# Patient Record
Sex: Male | Born: 1995 | Race: White | Hispanic: No | Marital: Single | Smoking: Current every day smoker
Health system: Southern US, Community
[De-identification: ages and names within clinical notes are randomized; demographics above are authoritative.]

## PROBLEM LIST (undated history)

## (undated) DIAGNOSIS — R079 Chest pain, unspecified: Secondary | ICD-10-CM

## (undated) DIAGNOSIS — F32A Depression, unspecified: Secondary | ICD-10-CM

## (undated) DIAGNOSIS — F419 Anxiety disorder, unspecified: Secondary | ICD-10-CM

## (undated) DIAGNOSIS — R0789 Other chest pain: Secondary | ICD-10-CM

## (undated) DIAGNOSIS — R945 Abnormal results of liver function studies: Secondary | ICD-10-CM

## (undated) HISTORY — DX: Depression, unspecified: F32.A

## (undated) HISTORY — PX: WISDOM TOOTH EXTRACTION: SHX21

## (undated) HISTORY — DX: Other chest pain: R07.89

## (undated) HISTORY — DX: Abnormal results of liver function studies: R94.5

## (undated) HISTORY — DX: Anxiety disorder, unspecified: F41.9

## (undated) HISTORY — DX: Chest pain, unspecified: R07.9

---
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
## 2016-04-20 ENCOUNTER — Emergency Department (HOSPITAL_COMMUNITY)
Admission: EM | Admit: 2016-04-20 | Discharge: 2016-04-20 | Disposition: A | Discharge status: Home / Self Care | Payer: Commercial Managed Care - PPO | Attending: Emergency Medicine | Admitting: Emergency Medicine

## 2016-04-20 ENCOUNTER — Encounter (HOSPITAL_COMMUNITY): Payer: Self-pay | Admitting: Emergency Medicine

## 2016-04-20 ENCOUNTER — Emergency Department (HOSPITAL_COMMUNITY): Payer: Commercial Managed Care - PPO

## 2016-04-20 DIAGNOSIS — F17200 Nicotine dependence, unspecified, uncomplicated: Secondary | ICD-10-CM | POA: Diagnosis not present

## 2016-04-20 DIAGNOSIS — K920 Hematemesis: Secondary | ICD-10-CM

## 2016-04-20 LAB — COMPREHENSIVE METABOLIC PANEL
ALBUMIN: 4.9 g/dL (ref 3.5–5.0)
ALT: 34 U/L (ref 17–63)
ANION GAP: 9 (ref 5–15)
AST: 30 U/L (ref 15–41)
Alkaline Phosphatase: 77 U/L (ref 38–126)
BUN: 10 mg/dL (ref 6–20)
CHLORIDE: 101 mmol/L (ref 101–111)
CO2: 26 mmol/L (ref 22–32)
Calcium: 10.1 mg/dL (ref 8.9–10.3)
Creatinine, Ser: 0.89 mg/dL (ref 0.61–1.24)
GFR calc non Af Amer: >60 mL/min (ref >60)
GLUCOSE: 82 mg/dL (ref 65–99)
Potassium: 4 mmol/L (ref 3.5–5.1)
SODIUM: 136 mmol/L (ref 135–145)
Total Bilirubin: 0.7 mg/dL (ref 0.3–1.2)
Total Protein: 7.3 g/dL (ref 6.5–8.1)

## 2016-04-20 LAB — CBC
HCT: 48.9 % (ref 39.0–52.0)
HEMOGLOBIN: 16.7 g/dL (ref 13.0–17.0)
MCH: 32 pg (ref 26.0–34.0)
MCHC: 34.2 g/dL (ref 30.0–36.0)
MCV: 93.7 fL (ref 78.0–100.0)
Platelets: 201 10*3/uL (ref 150–400)
RBC: 5.22 MIL/uL (ref 4.22–5.81)
RDW: 12.6 % (ref 11.5–15.5)
WBC: 7.5 10*3/uL (ref 4.0–10.5)

## 2016-04-20 LAB — LIPASE, BLOOD: LIPASE: 23 U/L (ref 11–51)

## 2016-04-20 MED ORDER — ONDANSETRON 4 MG PO TBDP: 4.0000 mg | ORAL_TABLET | Freq: Once | ORAL | Status: DC | PRN

## 2016-04-20 MED ORDER — ONDANSETRON 4 MG PO TBDP
ORAL_TABLET | ORAL | Status: AC
  Filled 2016-04-20: qty 1

## 2016-04-20 MED ORDER — PANTOPRAZOLE SODIUM 20 MG PO TBEC: 20.0000 mg | DELAYED_RELEASE_TABLET | Freq: Every day | ORAL | 0 refills | Status: DC

## 2016-04-20 NOTE — ED Triage Notes (Addendum)
Onset today states emesis bright red and then black blood x1 episode. Denies abdominal pain or diarrhea. Airway intact bilateral equal chest rise and fall.

## 2016-04-20 NOTE — ED Notes (Signed)
Pt informed of delay. Pt cooperative and understanding.

## 2016-04-20 NOTE — ED Provider Notes (Signed)
[facility]-EMERGENCY DEPT Provider Note   CSN: [id number] Arrival date & time: 04/20/16  1346     History   Chief Complaint Chief Complaint  Patient presents with  . Hematemesis    HPI Jerome Levine is a 20 y.o. male.  HPI   Patient is a 20 year old male who presents emergency department for evaluation of one episode of hematemesis. He states that he had one vomiting episode that occurred today at noon, roughly 7 hours ago. He states it was bright red blood the first portion of emesis and the end of his emesis became coffee-ground black.  She did not have any further vomiting. He denies abdominal pain, nausea, back pain. He was given Aleve partially one month ago in with a back strain that is only taken it one to 2 times in the past week, he does not take any other NSAIDs or steroids. He consumes alcohol daily, at least 3 beers a day and a 6-12 pack on the weekends.  He has no history of GI bleed, or GERD.  He denies any chest pain, shortness of breath, weakness, fatigue, pallor.  History reviewed. No pertinent past medical history.  There are no active problems to display for this patient.   History reviewed. No pertinent surgical history.     Home Medications    Prior to Admission medications   Medication Sig Start Date End Date Taking? Authorizing Provider  pantoprazole (PROTONIX) 20 MG tablet Take 1 tablet (20 mg total) by mouth daily. 04/20/16   Danelle Berry, PA-C    Family History No family history on file.  Social History Social History  Substance Use Topics  . Smoking status: Current Every Day Smoker  . Smokeless tobacco: Former Neurosurgeon  . Alcohol use Yes     Allergies   Review of patient's allergies indicates no known allergies.   Review of Systems Review of Systems  All other systems reviewed and are negative.    Physical Exam Updated Vital Signs BP 118/64 (BP Location: Right Arm)   Pulse (!) 52   Temp 98.5 F (36.9 C) (Oral)   Resp 16   Ht 5\' 11"   (1.803 m)   Wt 83.9 kg   SpO2 100%   BMI 25.80 kg/m   Physical Exam  Constitutional: He is oriented to person, place, and time. He appears well-developed and well-nourished. No distress.  HENT:  Head: Normocephalic and atraumatic.  Right Ear: External ear normal.  Left Ear: External ear normal.  Nose: Nose normal.  Mouth/Throat: Oropharynx is clear and moist. No oropharyngeal exudate.  Eyes: Conjunctivae and EOM are normal. Pupils are equal, round, and reactive to light. Right eye exhibits no discharge. Left eye exhibits no discharge. No scleral icterus.  Neck: Normal range of motion. Neck supple. No JVD present. No tracheal deviation present.  Cardiovascular: Normal rate, regular rhythm, normal heart sounds and intact distal pulses.  Exam reveals no gallop and no friction rub.   No murmur heard. Pulmonary/Chest: Effort normal and breath sounds normal. No stridor. No respiratory distress. He has no wheezes. He has no rales. He exhibits no tenderness.  Abdominal: Soft. Bowel sounds are normal. He exhibits no distension and no mass. There is no tenderness. There is no rebound and no guarding.  Musculoskeletal: Normal range of motion. He exhibits no edema.  Lymphadenopathy:    He has no cervical adenopathy.  Neurological: He is alert and oriented to person, place, and time. He exhibits normal muscle tone. Coordination normal.  Skin: Skin  is warm and dry. Capillary refill takes less than 2 seconds. No rash noted. He is not diaphoretic. No erythema. No pallor.  Psychiatric: He has a normal mood and affect. His behavior is normal. Judgment and thought content normal.  Nursing note and vitals reviewed.    ED Treatments / Results  Labs (all labs ordered are listed, but only abnormal results are displayed) Labs Reviewed  LIPASE, BLOOD  COMPREHENSIVE METABOLIC PANEL  CBC    EKG  EKG Interpretation None       Radiology Dg Chest 2 View  Result Date: 04/20/2016 CLINICAL DATA:   Hemoptysis this morning.  Smoker and ETOH use. EXAM: CHEST  2 VIEW COMPARISON:  None. FINDINGS: The heart size and mediastinal contours are within normal limits. Both lungs are clear. The visualized skeletal structures are unremarkable. IMPRESSION: No active cardiopulmonary disease. Electronically Signed   By: Elberta Fortisaniel  Boyle M.D.   On: 04/20/2016 15:28    Procedures Procedures (including critical care time)  Medications Ordered in ED Medications  ondansetron (ZOFRAN-ODT) disintegrating tablet 4 mg (not administered)     Initial Impression / Assessment and Plan / ED Course  I have reviewed the triage vital signs and the nursing notes.  Pertinent labs & imaging results that were available during my care of the patient were reviewed by me and considered in my medical decision making (see chart for details).  Clinical Course  One episode of vomiting that was read initially and then became black.  He had no further vomiting and denies melena or hematochezia. He has no abdominal pain, no symptoms of anemia, including no chest pain, no shortness of breath, no pallor, no weakness. Blood work obtained was unremarkable, chest x-ray was negative.  No further symptoms or vomiting episodes, do not suspect a continued GI bleed. Risk factors includes any recent NSAID use and daily alcohol use, discussed with him avoiding NSAIDs and decreasing alcohol intake. We'll give him PPI and GI referral as needed.  Pt stable to D/C home, return precautions reviewed and pt verbalized understanding.   Final Clinical Impressions(s) / ED Diagnoses   Final diagnoses:  Hematemesis without nausea    New Prescriptions New Prescriptions   PANTOPRAZOLE (PROTONIX) 20 MG TABLET    Take 1 tablet (20 mg total) by mouth daily.     Danelle BerryLeisa Dequarius Jeffries, PA-C 04/22/16 2314    Benjiman CoreNathan Pickering, MD 04/23/16 [phone]

## 2016-04-20 NOTE — Discharge Instructions (Signed)
Decrease your alcohol intake, avoid NSAIDs - non-steroidal anti-inflammatories, such as ibuprofen, naproxen, aleve. Take meds as prescribed You had negative X-rays.  Your blood work in normal.

## 2017-11-23 DIAGNOSIS — L731 Pseudofolliculitis barbae: Secondary | ICD-10-CM | POA: Diagnosis not present

## 2017-11-23 DIAGNOSIS — K1121 Acute sialoadenitis: Secondary | ICD-10-CM | POA: Diagnosis not present

## 2017-12-26 DIAGNOSIS — M546 Pain in thoracic spine: Secondary | ICD-10-CM | POA: Diagnosis not present

## 2017-12-26 DIAGNOSIS — S39012A Strain of muscle, fascia and tendon of lower back, initial encounter: Secondary | ICD-10-CM | POA: Diagnosis not present

## 2018-02-10 DIAGNOSIS — R599 Enlarged lymph nodes, unspecified: Secondary | ICD-10-CM | POA: Diagnosis not present

## 2018-03-18 DIAGNOSIS — R590 Localized enlarged lymph nodes: Secondary | ICD-10-CM | POA: Diagnosis not present

## 2018-03-18 DIAGNOSIS — F17200 Nicotine dependence, unspecified, uncomplicated: Secondary | ICD-10-CM | POA: Diagnosis not present

## 2018-03-18 DIAGNOSIS — R221 Localized swelling, mass and lump, neck: Secondary | ICD-10-CM | POA: Diagnosis not present

## 2018-05-06 ENCOUNTER — Other Ambulatory Visit: Payer: Self-pay | Admitting: Otolaryngology

## 2018-05-06 DIAGNOSIS — R590 Localized enlarged lymph nodes: Secondary | ICD-10-CM

## 2018-05-10 ENCOUNTER — Other Ambulatory Visit: Payer: Commercial Managed Care - PPO

## 2018-05-17 ENCOUNTER — Ambulatory Visit
Admission: RE | Admit: 2018-05-17 | Discharge: 2018-05-17 | Disposition: A | Discharge status: Home / Self Care | Payer: Commercial Managed Care - PPO | Source: Ambulatory Visit | Attending: Otolaryngology | Admitting: Otolaryngology

## 2018-05-17 DIAGNOSIS — R590 Localized enlarged lymph nodes: Secondary | ICD-10-CM

## 2018-05-17 DIAGNOSIS — R1310 Dysphagia, unspecified: Secondary | ICD-10-CM | POA: Diagnosis not present

## 2018-05-17 MED ORDER — IOPAMIDOL (ISOVUE-300) INJECTION 61%
75.0000 mL | Freq: Once | INTRAVENOUS | Status: AC | PRN
  Administered 2018-05-17: 75 mL via INTRAVENOUS

## 2018-08-05 DIAGNOSIS — N481 Balanitis: Secondary | ICD-10-CM | POA: Diagnosis not present

## 2018-10-10 DIAGNOSIS — K600 Acute anal fissure: Secondary | ICD-10-CM | POA: Diagnosis not present

## 2018-11-14 DIAGNOSIS — K602 Anal fissure, unspecified: Secondary | ICD-10-CM | POA: Diagnosis not present

## 2018-11-15 DIAGNOSIS — K625 Hemorrhage of anus and rectum: Secondary | ICD-10-CM | POA: Diagnosis not present

## 2018-11-24 DIAGNOSIS — K625 Hemorrhage of anus and rectum: Secondary | ICD-10-CM | POA: Insufficient documentation

## 2018-11-24 HISTORY — DX: Hemorrhage of anus and rectum: K62.5

## 2018-12-13 ENCOUNTER — Emergency Department (HOSPITAL_COMMUNITY): Payer: Commercial Managed Care - PPO

## 2018-12-13 ENCOUNTER — Emergency Department (HOSPITAL_COMMUNITY)
Admission: EM | Admit: 2018-12-13 | Discharge: 2018-12-14 | Disposition: A | Discharge status: Home / Self Care | Payer: Commercial Managed Care - PPO | Attending: Emergency Medicine | Admitting: Emergency Medicine

## 2018-12-13 ENCOUNTER — Encounter (HOSPITAL_COMMUNITY): Payer: Self-pay | Admitting: Emergency Medicine

## 2018-12-13 DIAGNOSIS — Z79899 Other long term (current) drug therapy: Secondary | ICD-10-CM | POA: Insufficient documentation

## 2018-12-13 DIAGNOSIS — R51 Headache: Secondary | ICD-10-CM | POA: Insufficient documentation

## 2018-12-13 DIAGNOSIS — M542 Cervicalgia: Secondary | ICD-10-CM | POA: Diagnosis not present

## 2018-12-13 DIAGNOSIS — F17210 Nicotine dependence, cigarettes, uncomplicated: Secondary | ICD-10-CM | POA: Insufficient documentation

## 2018-12-13 DIAGNOSIS — R519 Headache, unspecified: Secondary | ICD-10-CM

## 2018-12-13 LAB — CBC
HCT: 44.4 % (ref 39.0–52.0)
Hemoglobin: 15.7 g/dL (ref 13.0–17.0)
MCH: 31.5 pg (ref 26.0–34.0)
MCHC: 35.4 g/dL (ref 30.0–36.0)
MCV: 89.2 fL (ref 80.0–100.0)
Platelets: 196 10*3/uL (ref 150–400)
RBC: 4.98 MIL/uL (ref 4.22–5.81)
RDW: 12.4 % (ref 11.5–15.5)
WBC: 7.5 10*3/uL (ref 4.0–10.5)
nRBC: 0 % (ref 0.0–0.2)

## 2018-12-13 LAB — BASIC METABOLIC PANEL
Anion gap: 11 (ref 5–15)
BUN: 14 mg/dL (ref 6–20)
CO2: 24 mmol/L (ref 22–32)
Calcium: 9.5 mg/dL (ref 8.9–10.3)
Chloride: 97 mmol/L — ABNORMAL LOW (ref 98–111)
Creatinine, Ser: 0.8 mg/dL (ref 0.61–1.24)
GFR calc Af Amer: >60 mL/min (ref >60)
GFR calc non Af Amer: >60 mL/min (ref >60)
Glucose, Bld: 90 mg/dL (ref 70–99)
Potassium: 4 mmol/L (ref 3.5–5.1)
Sodium: 132 mmol/L — ABNORMAL LOW (ref 135–145)

## 2018-12-13 NOTE — ED Triage Notes (Signed)
Pt in POV, reports he was having intercourse when he developed sudden HA and nausea with radiation into neck. Pt reports all S/S have subsided besides slight nausea. No neuro deficits. A/O

## 2018-12-14 ENCOUNTER — Encounter (HOSPITAL_COMMUNITY): Payer: Self-pay | Admitting: Emergency Medicine

## 2018-12-14 ENCOUNTER — Emergency Department (HOSPITAL_COMMUNITY): Payer: Commercial Managed Care - PPO

## 2018-12-14 MED ORDER — ACETAMINOPHEN 500 MG PO TABS
1000.0000 mg | ORAL_TABLET | Freq: Once | ORAL | Status: AC
  Administered 2018-12-14: 1000 mg via ORAL
  Filled 2018-12-14: qty 2

## 2018-12-14 MED ORDER — IOHEXOL 350 MG/ML SOLN
100.0000 mL | Freq: Once | INTRAVENOUS | Status: AC | PRN
  Administered 2018-12-14: 100 mL via INTRAVENOUS

## 2018-12-14 NOTE — ED Notes (Signed)
Patient transported to CT 

## 2018-12-14 NOTE — ED Provider Notes (Signed)
[facility] Provider Note   CSN: [id number] Arrival date & time: 12/13/18  1942    History   Chief Complaint Chief Complaint  Patient presents with   Headache    HPI Thora LanceJustin Bourassa is a 23 y.o. male.     The history is provided by the patient.  Headache  Pain location:  Occipital Quality:  Dull Severity currently:  0/10 Severity at highest:  9/10 Onset quality:  Sudden Duration:  1 hour Timing:  Constant Progression:  Resolved Chronicity:  New Similar to prior headaches: no   Context: intercourse   Context comment:  5 large beers just and was intoxicated  Relieved by:  Nothing Worsened by:  Nothing Ineffective treatments:  None tried Associated symptoms: no cough, no diarrhea, no dizziness, no facial pain, no fatigue, no fever, no focal weakness, no nausea, no neck pain, no neck stiffness, no numbness, no seizures, no syncope, no vomiting and no weakness   Risk factors: no anger and no family hx of SAH   Patient had been drinking a lot this afternoon and admitted to being intoxicated and then engaged in intercourse with his significant other and had an occipital headache and said it was one of the worst he's had.  No neck stiffness, no vomiting. It subsided on its own during intercourse and has gone away but as it was different he c  History reviewed. No pertinent past medical history.  There are no active problems to display for this patient.   Past Surgical History:  Procedure Laterality Date   WISDOM TOOTH EXTRACTION          Home Medications    Prior to Admission medications   Medication Sig Start Date End Date Taking? Authorizing Provider  pantoprazole (PROTONIX) 20 MG tablet Take 1 tablet (20 mg total) by mouth daily. 04/20/16   Danelle Berryapia, Leisa, PA-C    Family History No family history on file.  Social History Social History   Tobacco Use   Smoking status: Current Every Day Smoker    Packs/day: 0.25   Types: Cigarettes   Smokeless tobacco: Former NeurosurgeonUser  Substance Use Topics   Alcohol use: Yes    Comment: Socially    Drug use: Yes    Types: Marijuana     Allergies   Patient has no known allergies.   Review of Systems Review of Systems  Constitutional: Negative for fatigue and fever.  Eyes: Negative for visual disturbance.  Respiratory: Negative for cough.   Cardiovascular: Negative for chest pain and syncope.  Gastrointestinal: Negative for diarrhea, nausea and vomiting.  Musculoskeletal: Negative for neck pain and neck stiffness.  Neurological: Positive for headaches. Negative for dizziness, focal weakness, seizures, syncope, facial asymmetry, speech difficulty, weakness and numbness.  All other systems reviewed and are negative.    Physical Exam Updated Vital Signs BP 106/74    Pulse (!) 59    Temp 98.2 F (36.8 C) (Oral)    Resp 17    Ht 5\' 11"  (1.803 m)    Wt 90.7 kg    SpO2 98%    BMI 27.89 kg/m   Physical Exam Vitals signs and nursing note reviewed.  Constitutional:      General: He is not in acute distress.    Appearance: He is normal weight. He is not toxic-appearing.  HENT:     Head: Normocephalic and atraumatic.     Nose: Nose normal.     Mouth/Throat:     Mouth:  Mucous membranes are moist.  Eyes:     Extraocular Movements: Extraocular movements intact.     Conjunctiva/sclera: Conjunctivae normal.     Pupils: Pupils are equal, round, and reactive to light.  Neck:     Musculoskeletal: Normal range of motion and neck supple.  Cardiovascular:     Rate and Rhythm: Normal rate and regular rhythm.     Pulses: Normal pulses.     Heart sounds: Normal heart sounds.  Pulmonary:     Effort: Pulmonary effort is normal.     Breath sounds: Normal breath sounds.  Abdominal:     General: Abdomen is flat. Bowel sounds are normal.  Musculoskeletal: Normal range of motion.  Skin:    General: Skin is warm and dry.     Capillary Refill: Capillary refill takes  less than 2 seconds.  Neurological:     General: No focal deficit present.     Mental Status: He is alert and oriented to person, place, and time.     Cranial Nerves: No cranial nerve deficit.     Deep Tendon Reflexes: Reflexes normal.  Psychiatric:        Mood and Affect: Mood normal.      ED Treatments / Results  Labs (all labs ordered are listed, but only abnormal results are displayed) Results for orders placed or performed during the hospital encounter of 12/13/18  CBC  Result Value Ref Range   WBC 7.5 4.0 - 10.5 K/uL   RBC 4.98 4.22 - 5.81 MIL/uL   Hemoglobin 15.7 13.0 - 17.0 g/dL   HCT 32.5 49.8 - 26.4 %   MCV 89.2 80.0 - 100.0 fL   MCH 31.5 26.0 - 34.0 pg   MCHC 35.4 30.0 - 36.0 g/dL   RDW 15.8 30.9 - 40.7 %   Platelets 196 150 - 400 K/uL   nRBC 0.0 0.0 - 0.2 %  Basic metabolic panel  Result Value Ref Range   Sodium 132 (L) 135 - 145 mmol/L   Potassium 4.0 3.5 - 5.1 mmol/L   Chloride 97 (L) 98 - 111 mmol/L   CO2 24 22 - 32 mmol/L   Glucose, Bld 90 70 - 99 mg/dL   BUN 14 6 - 20 mg/dL   Creatinine, Ser 6.80 0.61 - 1.24 mg/dL   Calcium 9.5 8.9 - 88.1 mg/dL   GFR calc non Af Amer >60 >60 mL/min   GFR calc Af Amer >60 >60 mL/min   Anion gap 11 5 - 15   Ct Angio Head W Or Wo Contrast  Result Date: 12/14/2018 CLINICAL DATA:  23 y/o M; sudden headache with nausea radiating into the neck during intercourse. EXAM: CT ANGIOGRAPHY HEAD AND NECK TECHNIQUE: Multidetector CT imaging of the head and neck was performed using the standard protocol during bolus administration of intravenous contrast. Multiplanar CT image reconstructions and MIPs were obtained to evaluate the vascular anatomy. Carotid stenosis measurements (when applicable) are obtained utilizing NASCET criteria, using the distal internal carotid diameter as the denominator. CONTRAST:  OMNIPAQUE IOHEXOL 350 MG/ML SOLN COMPARISON:  12/13/2018 CT head FINDINGS: CTA NECK FINDINGS Aortic arch: Left vertebral artery  arises from the aortic arch. Imaged portion shows no evidence of aneurysm or dissection. No significant stenosis of the major arch vessel origins. Right carotid system: No evidence of dissection, stenosis (50% or greater) or occlusion. Left carotid system: No evidence of dissection, stenosis (50% or greater) or occlusion. Vertebral arteries: Right dominant. No evidence of dissection, stenosis (50% or  greater) or occlusion. Skeleton: Negative. Other neck: Negative. Upper chest: Negative. Review of the MIP images confirms the above findings CTA HEAD FINDINGS Anterior circulation: No significant stenosis, proximal occlusion, aneurysm, or vascular malformation. Posterior circulation: No significant stenosis, proximal occlusion, aneurysm, or vascular malformation. Venous sinuses: As permitted by contrast timing, patent. Anatomic variants: Complete circle-of-Willis. Delayed phase: No abnormal intracranial enhancement. Review of the MIP images confirms the above findings IMPRESSION: Normal CTA of the head and neck. Electronically Signed   By: Mitzi Hansen M.D.   On: 12/14/2018 03:44   Ct Head Wo Contrast  Result Date: 12/13/2018 CLINICAL DATA:  Acute onset and nausea postcoital EXAM: CT HEAD WITHOUT CONTRAST TECHNIQUE: Contiguous axial images were obtained from the base of the skull through the vertex without intravenous contrast. COMPARISON:  None. FINDINGS: Brain: The ventricles are normal in size and configuration. There is no intracranial mass, hemorrhage, extra-axial fluid collection, or midline shift. The brain parenchyma appears unremarkable. There is no evident acute infarct. Vascular: There is no hyperdense vessel. There is no vascular calcification evident. Skull: The bony calvarium appears intact. Sinuses/Orbits: There is opacification of a midportion right ethmoid air cell. Other visualized paranasal sinuses are clear. Orbits appear symmetric bilaterally. Other: Mastoid air cells are clear.  IMPRESSION: Right-sided ethmoid air cell disease.  Study otherwise unremarkable. Electronically Signed   By: Bretta Bang III M.D.   On: 12/13/2018 20:47   Ct Angio Neck W And/or Wo Contrast  Result Date: 12/14/2018 CLINICAL DATA:  23 y/o M; sudden headache with nausea radiating into the neck during intercourse. EXAM: CT ANGIOGRAPHY HEAD AND NECK TECHNIQUE: Multidetector CT imaging of the head and neck was performed using the standard protocol during bolus administration of intravenous contrast. Multiplanar CT image reconstructions and MIPs were obtained to evaluate the vascular anatomy. Carotid stenosis measurements (when applicable) are obtained utilizing NASCET criteria, using the distal internal carotid diameter as the denominator. CONTRAST:  OMNIPAQUE IOHEXOL 350 MG/ML SOLN COMPARISON:  12/13/2018 CT head FINDINGS: CTA NECK FINDINGS Aortic arch: Left vertebral artery arises from the aortic arch. Imaged portion shows no evidence of aneurysm or dissection. No significant stenosis of the major arch vessel origins. Right carotid system: No evidence of dissection, stenosis (50% or greater) or occlusion. Left carotid system: No evidence of dissection, stenosis (50% or greater) or occlusion. Vertebral arteries: Right dominant. No evidence of dissection, stenosis (50% or greater) or occlusion. Skeleton: Negative. Other neck: Negative. Upper chest: Negative. Review of the MIP images confirms the above findings CTA HEAD FINDINGS Anterior circulation: No significant stenosis, proximal occlusion, aneurysm, or vascular malformation. Posterior circulation: No significant stenosis, proximal occlusion, aneurysm, or vascular malformation. Venous sinuses: As permitted by contrast timing, patent. Anatomic variants: Complete circle-of-Willis. Delayed phase: No abnormal intracranial enhancement. Review of the MIP images confirms the above findings IMPRESSION: Normal CTA of the head and neck. Electronically Signed    By: Mitzi Hansen M.D.   On: 12/14/2018 03:44   Medications  acetaminophen (TYLENOL) tablet 1,000 mg (1,000 mg Oral Given 12/14/18 0235)  iohexol (OMNIPAQUE) 350 MG/ML injection 100 mL (100 mLs Intravenous Contrast Given 12/14/18 0323)     Final Clinical Impressions(s) / ED Diagnoses        Return for intractable cough, coughing up blood,fevers >100.4 unrelieved by medication, shortness of breath, intractable vomiting, chest pain, shortness of breath, weakness,numbness, changes in speech, facial asymmetry,abdominal pain, passing out,Inability to tolerate liquids or food, cough, altered mental status or any concerns. No signs of systemic  illness or infection. The patient is nontoxic-appearing on exam and vital signs are within normal limits.   I have reviewed the triage vital signs and the nursing notes. Pertinent labs &imaging results that were available during my care of the patient were reviewed by me and considered in my medical decision making (see chart for details).  After history, exam, and medical workup I feel the patient has been appropriately medically screened and is safe for discharge home. Pertinent diagnoses were discussed with the patient. Patient was given return precautions    ED Discharge Orders       Bartley Vuolo, MD 12/14/18 [id number]

## 2019-03-01 ENCOUNTER — Encounter (HOSPITAL_COMMUNITY): Payer: Self-pay | Admitting: Emergency Medicine

## 2019-03-01 ENCOUNTER — Other Ambulatory Visit: Payer: Self-pay

## 2019-03-01 ENCOUNTER — Emergency Department (HOSPITAL_COMMUNITY): Payer: Commercial Managed Care - PPO

## 2019-03-01 ENCOUNTER — Emergency Department (HOSPITAL_COMMUNITY)
Admission: EM | Admit: 2019-03-01 | Discharge: 2019-03-01 | Disposition: A | Discharge status: Home / Self Care | Payer: Commercial Managed Care - PPO | Attending: Emergency Medicine | Admitting: Emergency Medicine

## 2019-03-01 DIAGNOSIS — S8992XA Unspecified injury of left lower leg, initial encounter: Secondary | ICD-10-CM | POA: Diagnosis present

## 2019-03-01 DIAGNOSIS — Y929 Unspecified place or not applicable: Secondary | ICD-10-CM | POA: Insufficient documentation

## 2019-03-01 DIAGNOSIS — S82392A Other fracture of lower end of left tibia, initial encounter for closed fracture: Secondary | ICD-10-CM | POA: Insufficient documentation

## 2019-03-01 DIAGNOSIS — S0081XA Abrasion of other part of head, initial encounter: Secondary | ICD-10-CM | POA: Insufficient documentation

## 2019-03-01 DIAGNOSIS — Y999 Unspecified external cause status: Secondary | ICD-10-CM | POA: Diagnosis not present

## 2019-03-01 DIAGNOSIS — F17210 Nicotine dependence, cigarettes, uncomplicated: Secondary | ICD-10-CM | POA: Insufficient documentation

## 2019-03-01 DIAGNOSIS — S82402A Unspecified fracture of shaft of left fibula, initial encounter for closed fracture: Secondary | ICD-10-CM | POA: Diagnosis not present

## 2019-03-01 DIAGNOSIS — Y939 Activity, unspecified: Secondary | ICD-10-CM | POA: Diagnosis not present

## 2019-03-01 LAB — BASIC METABOLIC PANEL
Anion gap: 11 (ref 5–15)
BUN: 9 mg/dL (ref 6–20)
CO2: 24 mmol/L (ref 22–32)
Calcium: 9.4 mg/dL (ref 8.9–10.3)
Chloride: 102 mmol/L (ref 98–111)
Creatinine, Ser: 0.92 mg/dL (ref 0.61–1.24)
GFR calc Af Amer: >60 mL/min (ref >60)
GFR calc non Af Amer: >60 mL/min (ref >60)
Glucose, Bld: 101 mg/dL — ABNORMAL HIGH (ref 70–99)
Potassium: 3.8 mmol/L (ref 3.5–5.1)
Sodium: 137 mmol/L (ref 135–145)

## 2019-03-01 LAB — CBC
HCT: 46.4 % (ref 39.0–52.0)
Hemoglobin: 16 g/dL (ref 13.0–17.0)
MCH: 31.3 pg (ref 26.0–34.0)
MCHC: 34.5 g/dL (ref 30.0–36.0)
MCV: 90.8 fL (ref 80.0–100.0)
Platelets: 177 10*3/uL (ref 150–400)
RBC: 5.11 MIL/uL (ref 4.22–5.81)
RDW: 12.1 % (ref 11.5–15.5)
WBC: 8.1 10*3/uL (ref 4.0–10.5)
nRBC: 0 % (ref 0.0–0.2)

## 2019-03-01 MED ORDER — SODIUM CHLORIDE 0.9 % IV BOLUS
1000.0000 mL | Freq: Once | INTRAVENOUS | Status: AC
  Administered 2019-03-01: 1000 mL via INTRAVENOUS

## 2019-03-01 MED ORDER — OXYCODONE-ACETAMINOPHEN 5-325 MG PO TABS
2.0000 | ORAL_TABLET | Freq: Once | ORAL | Status: AC
  Administered 2019-03-01: 2 via ORAL
  Filled 2019-03-01: qty 2

## 2019-03-01 MED ORDER — OXYCODONE-ACETAMINOPHEN 5-325 MG PO TABS: 2.0000 | ORAL_TABLET | ORAL | 0 refills | Status: DC | PRN

## 2019-03-01 NOTE — Progress Notes (Signed)
Orthopedic Tech Progress Note Patient Details:  Jerome Levine [birth year] [id number]  Ortho Devices Type of Ortho Device: Crutches, Post (short leg) splint Ortho Device/Splint Location: lle Ortho Device/Splint Interventions: Ordered, Application, Adjustment   Post Interventions Patient Tolerated: Well Instructions Provided: Care of device, Adjustment of device   Karolee Stamps 03/01/2019, 9:21 PM
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
## 2019-03-01 NOTE — ED Notes (Signed)
Patient verbalizes understanding of discharge instructions. Opportunity for questioning and answers were provided. Armband removed by staff, pt discharged from ED in wheelchair but ambulatory with crutches provided.
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
## 2019-03-01 NOTE — Discharge Instructions (Signed)
Please keep leg elevated and do not put weight on the left leg Use cold therapy for pain and swelling Return if worsening pain and swelling not controlled by elevation , cold therapy, and pain medications Call DR. Haddix office Monday morning for recheck next week
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
## 2019-03-04 ENCOUNTER — Other Ambulatory Visit (HOSPITAL_COMMUNITY): Payer: Commercial Managed Care - PPO

## 2019-03-04 ENCOUNTER — Other Ambulatory Visit: Payer: Self-pay

## 2019-03-04 ENCOUNTER — Encounter: Payer: Self-pay | Admitting: Orthopaedic Surgery

## 2019-03-04 ENCOUNTER — Encounter (HOSPITAL_BASED_OUTPATIENT_CLINIC_OR_DEPARTMENT_OTHER): Payer: Self-pay | Admitting: *Deleted

## 2019-03-04 ENCOUNTER — Ambulatory Visit (INDEPENDENT_AMBULATORY_CARE_PROVIDER_SITE_OTHER): Payer: Commercial Managed Care - PPO | Admitting: Orthopaedic Surgery

## 2019-03-04 DIAGNOSIS — S82892A Other fracture of left lower leg, initial encounter for closed fracture: Secondary | ICD-10-CM

## 2019-03-04 HISTORY — DX: Other fracture of left lower leg, initial encounter for closed fracture: S82.892A
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
## 2019-03-06 ENCOUNTER — Ambulatory Visit (HOSPITAL_BASED_OUTPATIENT_CLINIC_OR_DEPARTMENT_OTHER)
Admission: RE | Admit: 2019-03-06 | Discharge: 2019-03-06 | Disposition: A | Discharge status: Home / Self Care | Payer: Commercial Managed Care - PPO | Attending: Orthopaedic Surgery | Admitting: Orthopaedic Surgery

## 2019-03-06 ENCOUNTER — Other Ambulatory Visit: Payer: Self-pay

## 2019-03-06 ENCOUNTER — Ambulatory Visit (HOSPITAL_COMMUNITY): Payer: Commercial Managed Care - PPO

## 2019-03-06 ENCOUNTER — Encounter (HOSPITAL_BASED_OUTPATIENT_CLINIC_OR_DEPARTMENT_OTHER): Payer: Self-pay | Admitting: *Deleted

## 2019-03-06 ENCOUNTER — Ambulatory Visit (HOSPITAL_BASED_OUTPATIENT_CLINIC_OR_DEPARTMENT_OTHER): Payer: Commercial Managed Care - PPO | Admitting: Anesthesiology

## 2019-03-06 ENCOUNTER — Encounter (HOSPITAL_BASED_OUTPATIENT_CLINIC_OR_DEPARTMENT_OTHER)
Admission: RE | Disposition: A | Discharge status: Home / Self Care | Payer: Self-pay | Source: Home / Self Care | Attending: Orthopaedic Surgery

## 2019-03-06 DIAGNOSIS — S8262XA Displaced fracture of lateral malleolus of left fibula, initial encounter for closed fracture: Secondary | ICD-10-CM | POA: Insufficient documentation

## 2019-03-06 DIAGNOSIS — F17210 Nicotine dependence, cigarettes, uncomplicated: Secondary | ICD-10-CM | POA: Insufficient documentation

## 2019-03-06 DIAGNOSIS — S93432A Sprain of tibiofibular ligament of left ankle, initial encounter: Secondary | ICD-10-CM | POA: Diagnosis not present

## 2019-03-06 DIAGNOSIS — S82892A Other fracture of left lower leg, initial encounter for closed fracture: Secondary | ICD-10-CM | POA: Diagnosis not present

## 2019-03-06 DIAGNOSIS — X58XXXA Exposure to other specified factors, initial encounter: Secondary | ICD-10-CM | POA: Insufficient documentation

## 2019-03-06 DIAGNOSIS — Z419 Encounter for procedure for purposes other than remedying health state, unspecified: Secondary | ICD-10-CM

## 2019-03-06 HISTORY — PX: ORIF ANKLE FRACTURE: SHX5408

## 2019-03-06 SURGERY — OPEN REDUCTION INTERNAL FIXATION (ORIF) ANKLE FRACTURE
Anesthesia: General | Site: Ankle | Laterality: Left

## 2019-03-06 MED ORDER — OXYCODONE HCL 5 MG PO TABS
5.0000 mg | ORAL_TABLET | Freq: Once | ORAL | Status: AC | PRN
  Administered 2019-03-06: 17:00:00 5 mg via ORAL
  Filled 2019-03-06: qty 1

## 2019-03-06 MED ORDER — FENTANYL CITRATE (PF) 100 MCG/2ML IJ SOLN
INTRAMUSCULAR | Status: AC
  Filled 2019-03-06: qty 2

## 2019-03-06 MED ORDER — METHOCARBAMOL 750 MG PO TABS: 750.0000 mg | ORAL_TABLET | Freq: Two times a day (BID) | ORAL | 0 refills | Status: DC | PRN

## 2019-03-06 MED ORDER — ONDANSETRON HCL 4 MG/2ML IJ SOLN: 4.0000 mg | Freq: Once | INTRAMUSCULAR | Status: DC | PRN

## 2019-03-06 MED ORDER — LACTATED RINGERS IV SOLN
INTRAVENOUS | Status: DC
  Administered 2019-03-06 (×2): via INTRAVENOUS

## 2019-03-06 MED ORDER — ONDANSETRON HCL 4 MG/2ML IJ SOLN
INTRAMUSCULAR | Status: DC | PRN
  Administered 2019-03-06: 4 mg via INTRAVENOUS

## 2019-03-06 MED ORDER — ACETAMINOPHEN 325 MG PO TABS: 325.0000 mg | ORAL_TABLET | ORAL | Status: DC | PRN

## 2019-03-06 MED ORDER — DEXAMETHASONE SODIUM PHOSPHATE 10 MG/ML IJ SOLN
INTRAMUSCULAR | Status: DC | PRN
  Administered 2019-03-06: 10 mg via INTRAVENOUS

## 2019-03-06 MED ORDER — PROPOFOL 10 MG/ML IV BOLUS
INTRAVENOUS | Status: AC
  Filled 2019-03-06: qty 40

## 2019-03-06 MED ORDER — ONDANSETRON HCL 4 MG PO TABS: 4.0000 mg | ORAL_TABLET | Freq: Three times a day (TID) | ORAL | 0 refills | Status: DC | PRN

## 2019-03-06 MED ORDER — LIDOCAINE 2% (20 MG/ML) 5 ML SYRINGE
INTRAMUSCULAR | Status: AC
  Filled 2019-03-06: qty 5

## 2019-03-06 MED ORDER — FENTANYL CITRATE (PF) 100 MCG/2ML IJ SOLN
50.0000 ug | INTRAMUSCULAR | Status: DC | PRN
  Administered 2019-03-06: 100 ug via INTRAVENOUS

## 2019-03-06 MED ORDER — SENNOSIDES-DOCUSATE SODIUM 8.6-50 MG PO TABS: 1.0000 | ORAL_TABLET | Freq: Every evening | ORAL | 1 refills | Status: DC | PRN

## 2019-03-06 MED ORDER — ACETAMINOPHEN 160 MG/5ML PO SOLN: 325.0000 mg | ORAL | Status: DC | PRN

## 2019-03-06 MED ORDER — ONDANSETRON HCL 4 MG/2ML IJ SOLN
INTRAMUSCULAR | Status: AC
  Filled 2019-03-06: qty 2

## 2019-03-06 MED ORDER — OXYCODONE HCL ER 10 MG PO T12A: 10.0000 mg | EXTENDED_RELEASE_TABLET | Freq: Two times a day (BID) | ORAL | 0 refills | Status: AC

## 2019-03-06 MED ORDER — FENTANYL CITRATE (PF) 100 MCG/2ML IJ SOLN
25.0000 ug | INTRAMUSCULAR | Status: DC | PRN
  Administered 2019-03-06: 50 ug via INTRAVENOUS

## 2019-03-06 MED ORDER — OXYCODONE HCL 5 MG/5ML PO SOLN: 5.0000 mg | Freq: Once | ORAL | Status: AC | PRN

## 2019-03-06 MED ORDER — MIDAZOLAM HCL 2 MG/2ML IJ SOLN
1.0000 mg | INTRAMUSCULAR | Status: DC | PRN
  Administered 2019-03-06 (×2): 1 mg via INTRAVENOUS
  Administered 2019-03-06: 2 mg via INTRAVENOUS

## 2019-03-06 MED ORDER — CEFAZOLIN SODIUM-DEXTROSE 2-4 GM/100ML-% IV SOLN
INTRAVENOUS | Status: AC
  Filled 2019-03-06: qty 100

## 2019-03-06 MED ORDER — PROPOFOL 10 MG/ML IV BOLUS
INTRAVENOUS | Status: DC | PRN
  Administered 2019-03-06: 200 mg via INTRAVENOUS

## 2019-03-06 MED ORDER — CALCIUM CARBONATE-VITAMIN D 500-200 MG-UNIT PO TABS: 1.0000 | ORAL_TABLET | Freq: Three times a day (TID) | ORAL | 6 refills | Status: DC

## 2019-03-06 MED ORDER — OXYCODONE HCL 5 MG PO TABS: 5.0000 mg | ORAL_TABLET | Freq: Three times a day (TID) | ORAL | 0 refills | Status: DC | PRN

## 2019-03-06 MED ORDER — MEPERIDINE HCL 25 MG/ML IJ SOLN: 6.2500 mg | INTRAMUSCULAR | Status: DC | PRN

## 2019-03-06 MED ORDER — LIDOCAINE HCL (CARDIAC) PF 100 MG/5ML IV SOSY
PREFILLED_SYRINGE | INTRAVENOUS | Status: DC | PRN
  Administered 2019-03-06: 75 mg via INTRAVENOUS

## 2019-03-06 MED ORDER — MIDAZOLAM HCL 2 MG/2ML IJ SOLN
INTRAMUSCULAR | Status: AC
  Filled 2019-03-06: qty 2

## 2019-03-06 MED ORDER — ASPIRIN EC 81 MG PO TBEC: 81.0000 mg | DELAYED_RELEASE_TABLET | Freq: Two times a day (BID) | ORAL | 0 refills | Status: DC

## 2019-03-06 MED ORDER — PROMETHAZINE HCL 25 MG PO TABS: 25.0000 mg | ORAL_TABLET | Freq: Four times a day (QID) | ORAL | 1 refills | Status: DC | PRN

## 2019-03-06 MED ORDER — ROPIVACAINE HCL 5 MG/ML IJ SOLN
INTRAMUSCULAR | Status: DC | PRN
  Administered 2019-03-06: 30 mL via PERINEURAL

## 2019-03-06 MED ORDER — DEXAMETHASONE SODIUM PHOSPHATE 10 MG/ML IJ SOLN
INTRAMUSCULAR | Status: AC
  Filled 2019-03-06: qty 1

## 2019-03-06 MED ORDER — SCOPOLAMINE 1 MG/3DAYS TD PT72: 1.0000 | MEDICATED_PATCH | Freq: Once | TRANSDERMAL | Status: DC | PRN

## 2019-03-06 MED ORDER — CEFAZOLIN SODIUM-DEXTROSE 2-4 GM/100ML-% IV SOLN
2.0000 g | INTRAVENOUS | Status: AC
  Administered 2019-03-06: 2 g via INTRAVENOUS

## 2019-03-06 SURGICAL SUPPLY — 89 items
BANDAGE ESMARK 6X9 LF (GAUZE/BANDAGES/DRESSINGS) ×1 IMPLANT
BIT DRILL 125X2.7XAO QCK (BIT) ×1 IMPLANT
BIT DRILL 2.7 (BIT) ×2
BIT DRL 125X2.7XAO QCK (BIT) ×1
BLADE HEX COATED 2.75 (ELECTRODE) ×3 IMPLANT
BLADE SURG 15 STRL LF DISP TIS (BLADE) ×2 IMPLANT
BLADE SURG 15 STRL SS (BLADE) ×4
BNDG COHESIVE 6X5 TAN STRL LF (GAUZE/BANDAGES/DRESSINGS) ×3 IMPLANT
BNDG ELASTIC 4X5.8 VLCR STR LF (GAUZE/BANDAGES/DRESSINGS) IMPLANT
BNDG ELASTIC 6X5.8 VLCR STR LF (GAUZE/BANDAGES/DRESSINGS) ×3 IMPLANT
BNDG ESMARK 6X9 LF (GAUZE/BANDAGES/DRESSINGS) ×3
BRUSH SCRUB EZ PLAIN DRY (MISCELLANEOUS) ×3 IMPLANT
CANISTER SUCT 1200ML W/VALVE (MISCELLANEOUS) ×3 IMPLANT
COVER BACK TABLE REUSABLE LG (DRAPES) ×3 IMPLANT
COVER MAYO STAND REUSABLE (DRAPES) IMPLANT
COVER WAND RF STERILE (DRAPES) IMPLANT
CUFF TOURN SGL QUICK 34 (TOURNIQUET CUFF)
CUFF TRNQT CYL 34X4.125X (TOURNIQUET CUFF) IMPLANT
DECANTER SPIKE VIAL GLASS SM (MISCELLANEOUS) IMPLANT
DRAPE C-ARM 42X72 X-RAY (DRAPES) ×3 IMPLANT
DRAPE C-ARMOR (DRAPES) ×3 IMPLANT
DRAPE EXTREMITY T 121X128X90 (DISPOSABLE) ×3 IMPLANT
DRAPE HALF SHEET 70X43 (DRAPES) ×3 IMPLANT
DRAPE IMP U-DRAPE 54X76 (DRAPES) ×3 IMPLANT
DRAPE INCISE IOBAN 66X45 STRL (DRAPES) IMPLANT
DRAPE SURG 17X23 STRL (DRAPES) IMPLANT
DRSG PAD ABDOMINAL 8X10 ST (GAUZE/BANDAGES/DRESSINGS) ×6 IMPLANT
DURAPREP 26ML APPLICATOR (WOUND CARE) ×3 IMPLANT
ELECT REM PT RETURN 9FT ADLT (ELECTROSURGICAL) ×3
ELECTRODE REM PT RTRN 9FT ADLT (ELECTROSURGICAL) ×1 IMPLANT
GAUZE SPONGE 4X4 12PLY STRL (GAUZE/BANDAGES/DRESSINGS) ×3 IMPLANT
GAUZE XEROFORM 1X8 LF (GAUZE/BANDAGES/DRESSINGS) ×3 IMPLANT
GLOVE BIOGEL PI IND STRL 7.0 (GLOVE) ×3 IMPLANT
GLOVE BIOGEL PI IND STRL 8 (GLOVE) ×1 IMPLANT
GLOVE BIOGEL PI INDICATOR 7.0 (GLOVE) ×6
GLOVE BIOGEL PI INDICATOR 8 (GLOVE) ×2
GLOVE ECLIPSE 6.5 STRL STRAW (GLOVE) ×6 IMPLANT
GLOVE ECLIPSE 7.0 STRL STRAW (GLOVE) ×3 IMPLANT
GLOVE SKINSENSE NS SZ7.5 (GLOVE) ×2
GLOVE SKINSENSE STRL SZ7.5 (GLOVE) ×1 IMPLANT
GLOVE SURG SYN 7.5  E (GLOVE) ×2
GLOVE SURG SYN 7.5 E (GLOVE) ×1 IMPLANT
GOWN STRL REIN XL XLG (GOWN DISPOSABLE) ×3 IMPLANT
GOWN STRL REUS W/ TWL LRG LVL3 (GOWN DISPOSABLE) ×2 IMPLANT
GOWN STRL REUS W/ TWL XL LVL3 (GOWN DISPOSABLE) ×1 IMPLANT
GOWN STRL REUS W/TWL LRG LVL3 (GOWN DISPOSABLE) ×4
GOWN STRL REUS W/TWL XL LVL3 (GOWN DISPOSABLE) ×2
IMPL TIGHTROP W/DRV K-LESS (Anchor) ×1 IMPLANT
IMPLANT TIGHTROPE W/DRV K-LESS (Anchor) ×3 IMPLANT
K-WIRE 1.6 (WIRE) ×4
K-WIRE FX150X1.6XTROC TIP (WIRE) ×2
KWIRE FX 150X1.6 TROC TIP (WIRE) ×2 IMPLANT
MANIFOLD NEPTUNE II (INSTRUMENTS) ×3 IMPLANT
NEEDLE HYPO 22GX1.5 SAFETY (NEEDLE) IMPLANT
NS IRRIG 1000ML POUR BTL (IV SOLUTION) ×3 IMPLANT
PACK BASIN DAY SURGERY FS (CUSTOM PROCEDURE TRAY) ×3 IMPLANT
PAD CAST 3X4 CTTN HI CHSV (CAST SUPPLIES) IMPLANT
PAD CAST 4YDX4 CTTN HI CHSV (CAST SUPPLIES) IMPLANT
PADDING CAST COTTON 3X4 STRL (CAST SUPPLIES)
PADDING CAST COTTON 4X4 STRL (CAST SUPPLIES)
PADDING CAST COTTON 6X4 STRL (CAST SUPPLIES) IMPLANT
PADDING CAST SYN 6 (CAST SUPPLIES) ×2
PADDING CAST SYNTHETIC 4 (CAST SUPPLIES) ×2
PADDING CAST SYNTHETIC 4X4 STR (CAST SUPPLIES) ×1 IMPLANT
PADDING CAST SYNTHETIC 6X4 NS (CAST SUPPLIES) ×1 IMPLANT
PENCIL BUTTON HOLSTER BLD 10FT (ELECTRODE) ×3 IMPLANT
PLATE 1/3 TUB 144 12H (Plate) ×3 IMPLANT
SCREW CORTEX 3.5X14MM (Screw) ×15 IMPLANT
SCREW CORTEX 3.5X16MM (Screw) ×12 IMPLANT
SLEEVE SCD COMPRESS KNEE MED (MISCELLANEOUS) ×3 IMPLANT
SPLINT FIBERGLASS 4X30 (CAST SUPPLIES) ×6 IMPLANT
SPONGE LAP 18X18 RF (DISPOSABLE) ×3 IMPLANT
SUCTION FRAZIER HANDLE 10FR (MISCELLANEOUS) ×2
SUCTION TUBE FRAZIER 10FR DISP (MISCELLANEOUS) ×1 IMPLANT
SUT ETHILON 3 0 PS 1 (SUTURE) ×3 IMPLANT
SUT VIC AB 0 CT1 27 (SUTURE)
SUT VIC AB 0 CT1 27XBRD ANBCTR (SUTURE) IMPLANT
SUT VIC AB 2-0 CT1 27 (SUTURE) ×4
SUT VIC AB 2-0 CT1 TAPERPNT 27 (SUTURE) ×2 IMPLANT
SUT VIC AB 3-0 SH 27 (SUTURE)
SUT VIC AB 3-0 SH 27X BRD (SUTURE) IMPLANT
SYR BULB 3OZ (MISCELLANEOUS) ×3 IMPLANT
SYR CONTROL 10ML LL (SYRINGE) IMPLANT
TOWEL GREEN STERILE FF (TOWEL DISPOSABLE) ×3 IMPLANT
TRAY DSU PREP LF (CUSTOM PROCEDURE TRAY) ×3 IMPLANT
TUBE CONNECTING 20'X1/4 (TUBING) ×1
TUBE CONNECTING 20X1/4 (TUBING) ×2 IMPLANT
UNDERPAD 30X30 (UNDERPADS AND DIAPERS) ×3 IMPLANT
YANKAUER SUCT BULB TIP NO VENT (SUCTIONS) ×3 IMPLANT

## 2019-03-06 NOTE — Anesthesia Procedure Notes (Signed)
Procedure Name: LMA Insertion Date/Time: 03/06/2019 2:07 PM Performed by: Willa Frater, CRNA Pre-anesthesia Checklist: Patient identified, Emergency Drugs available, Suction available and Patient being monitored Patient Re-evaluated:Patient Re-evaluated prior to induction Oxygen Delivery Method: Circle system utilized Preoxygenation: Pre-oxygenation with 100% oxygen Induction Type: IV induction Ventilation: Mask ventilation without difficulty LMA: LMA inserted LMA Size: 5.0 Number of attempts: 1 Airway Equipment and Method: Bite block Placement Confirmation: positive ETCO2 Tube secured with: Tape Dental Injury: Teeth and Oropharynx as per pre-operative assessment

## 2019-03-06 NOTE — Anesthesia Preprocedure Evaluation (Addendum)
Anesthesia Evaluation  Patient identified by MRN, date of birth, ID band Patient awake    Reviewed: Allergy & Precautions, H&P , NPO status , Patient's Chart, lab work & pertinent test results, reviewed documented beta blocker date and time   Airway Mallampati: II  TM Distance: >3 FB Neck ROM: Full    Dental no notable dental hx.    Pulmonary neg pulmonary ROS, Current Smoker and Patient abstained from smoking.,    Pulmonary exam normal breath sounds clear to auscultation       Cardiovascular Exercise Tolerance: Good negative cardio ROS Normal cardiovascular exam Rhythm:Regular Rate:Normal     Neuro/Psych negative neurological ROS  negative psych ROS   GI/Hepatic negative GI ROS, Neg liver ROS,   Endo/Other  negative endocrine ROS  Renal/GU negative Renal ROS  negative genitourinary   Musculoskeletal   Abdominal   Peds  Hematology negative hematology ROS (+)   Anesthesia Other Findings   Reproductive/Obstetrics negative OB ROS                           Anesthesia Physical Anesthesia Plan  ASA: II  Anesthesia Plan: General   Post-op Pain Management: GA combined w/ Regional for post-op pain   Induction:   PONV Risk Score and Plan: 2 and Ondansetron, Dexamethasone and Treatment may vary due to age or medical condition  Airway Management Planned: LMA  Additional Equipment:   Intra-op Plan:   Post-operative Plan: Extubation in OR  Informed Consent: I have reviewed the patients History and Physical, chart, labs and discussed the procedure including the risks, benefits and alternatives for the proposed anesthesia with the patient or authorized representative who has indicated his/her understanding and acceptance.     Dental Advisory Given  Plan Discussed with: CRNA, Anesthesiologist and Surgeon  Anesthesia Plan Comments:        Anesthesia Quick Evaluation

## 2019-03-06 NOTE — Transfer of Care (Signed)
Immediate Anesthesia Transfer of Care Note  Patient: Jerome Levine  Procedure(s) Performed: OPEN REDUCTION INTERNAL FIXATION (ORIF) LEFT LATERAL MALLEOLUS POSSIBLE SYNDESMOSIS (Left Ankle)  Patient Location: PACU  Anesthesia Type:GA combined with regional for post-op pain  Level of Consciousness: awake, alert  and oriented  Airway & Oxygen Therapy: Patient Spontanous Breathing and Patient connected to nasal cannula oxygen  Post-op Assessment: Report given to RN and Post -op Vital signs reviewed and stable  Post vital signs: Reviewed and stable  Last Vitals:  Vitals Value Taken Time  BP    Temp    Pulse 86 03/06/19 1552  Resp 11 03/06/19 1552  SpO2 100 % 03/06/19 1552  Vitals shown include unvalidated device data.  Last Pain:  Vitals:   03/06/19 1143  TempSrc: Oral  PainSc: 3       Patients Stated Pain Goal: 3 (46/27/03 5009)  Complications: No apparent anesthesia complications
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
## 2019-03-06 NOTE — H&P (Signed)
    PREOPERATIVE H&P  Chief Complaint: left lateral malleolus fracture  HPI: Jerome Levine is a 23 y.o. male who presents for surgical treatment of left lateral malleolus fracture.  He denies any changes in medical history.  History reviewed. No pertinent past medical history. Past Surgical History:  Procedure Laterality Date  . WISDOM TOOTH EXTRACTION     Social History   Socioeconomic History  . Marital status: Single    Spouse name: Not on file  . Number of children: Not on file  . Years of education: Not on file  . Highest education level: Not on file  Occupational History  . Not on file  Social Needs  . Financial resource strain: Not on file  . Food insecurity    Worry: Not on file    Inability: Not on file  . Transportation needs    Medical: Not on file    Non-medical: Not on file  Tobacco Use  . Smoking status: Current Every Day Smoker    Packs/day: 0.25    Types: Cigarettes  . Smokeless tobacco: Former Network engineer and Sexual Activity  . Alcohol use: Yes    Comment: Socially   . Drug use: Yes    Types: Marijuana  . Sexual activity: Not on file  Lifestyle  . Physical activity    Days per week: Not on file    Minutes per session: Not on file  . Stress: Not on file  Relationships  . Social Herbalist on phone: Not on file    Gets together: Not on file    Attends religious service: Not on file    Active member of club or organization: Not on file    Attends meetings of clubs or organizations: Not on file    Relationship status: Not on file  Other Topics Concern  . Not on file  Social History Narrative  . Not on file   History reviewed. No pertinent family history. No Known Allergies Prior to Admission medications   Medication Sig Start Date End Date Taking? Authorizing Provider  oxyCODONE-acetaminophen (PERCOCET/ROXICET) 5-325 MG tablet Take 2 tablets by mouth every 4 (four) hours as needed for severe pain. 03/01/19  Yes Pattricia Boss,  MD  Psyllium (METAMUCIL PO) Take 30 mLs by mouth See admin instructions. Mix 30 mls powder in water and drink every other night   Yes [provider]     Positive ROS: All other systems have been reviewed and were otherwise negative with the exception of those mentioned in the HPI and as above.  Physical Exam: General: Alert, no acute distress Cardiovascular: No pedal edema Respiratory: No cyanosis, no use of accessory musculature GI: abdomen soft Skin: No lesions in the area of chief complaint Neurologic: Sensation intact distally Psychiatric: Patient is competent for consent with normal mood and affect Lymphatic: no lymphedema  MUSCULOSKELETAL: exam stable  Assessment: left lateral malleolus fracture  Plan: Plan for Procedure(s): OPEN REDUCTION INTERNAL FIXATION (ORIF) LEFT LATERAL MALLEOLUS POSSIBLE SYNDESMOSIS  The risks benefits and alternatives were discussed with the patient including but not limited to the risks of nonoperative treatment, versus surgical intervention including infection, bleeding, nerve injury,  blood clots, cardiopulmonary complications, morbidity, mortality, among others, and they were willing to proceed.   Eduard Roux, MD   03/06/2019 8:04 AM

## 2019-03-06 NOTE — Progress Notes (Signed)
Assisted Dr. Carignan with left, ultrasound guided, popliteal block. Side rails up, monitors on throughout procedure. See vital signs in flow sheet. Tolerated Procedure well. 

## 2019-03-06 NOTE — Anesthesia Procedure Notes (Signed)
Anesthesia Regional Block: Popliteal block   Pre-Anesthetic Checklist: ,, timeout performed, Correct Patient, Correct Site, Correct Laterality, Correct Procedure, Correct Position, site marked, Risks and benefits discussed,  Surgical consent,  Pre-op evaluation,  At surgeon's request and post-op pain management  Laterality: Left and Lower  Prep: Maximum Sterile Barrier Precautions used, chloraprep       Needles:  Injection technique: Single-shot  Needle Type: Echogenic Stimulator Needle     Needle Length: 10cm      Additional Needles:   Procedures:,,,, ultrasound used (permanent image in chart),,,,  Narrative:  Start time: 03/06/2019 12:55 PM End time: 03/06/2019 1:05 PM Injection made incrementally with aspirations every 5 mL.  Performed by: Personally  Anesthesiologist: Montez Hageman, MD  Additional Notes: Risks, benefits and alternative to block explained extensively.  Patient tolerated procedure well, without complications.

## 2019-03-06 NOTE — Discharge Instructions (Signed)
1. Keep splint clean and dry 2. Elevate foot above level of the heart 3. Take aspirin to prevent blood clots 4. Take pain meds as needed 5. Strict non weight bearing to operative extremity  Discharge Instructions After Orthopedic Procedures:  *You may feel tired and weak following your procedure. It is recommended that you limit physical activity for the next 24 hours and rest at home for the remainder of today and tomorrow. *No strenuous activity should be started without your doctor's permission.  Elevate the extremity that you had surgery on to a level above your heart. This should continue for 48 hours or as instructed by your doctor.  If you had hand, arm or shoulder surgery you should move your fingers frequently unless otherwise instructed by your doctor.  If you had foot, knee or leg surgery you should wiggle your toes frequently unless otherwise instructed by your doctor.  Follow your doctor's exact instructions for activity at home. Use your home equipment as instructed. (Crutches, hard shoes, slings etc.)  Limit your activity as instructed by your doctor.  Report to your doctor should any of the following occur: 1. Extreme swelling of your fingers or toes. 2. Inability to wiggle your fingers or toes. 3. Coldness, pale or bluish color in your fingers or toes. 4. Loss of sensation, numbness or tingling of your fingers or toes. 5. Unusual smell or odor from under your dressing or cast. 6. Excessive bleeding or drainage from the surgical site. 7. Pain not relieved by medication your doctor has prescribed for you. 8. Cast or dressing too tight (do not get your dressing or cast wet or put anything under          your dressing or cast.)  *Do not change your dressing unless instructed by your doctor or discharge nurse. Then follow exact instructions.  *Follow labeled instructions for any medications that your doctor may have prescribed for you. *Should any questions or  complications develop following your procedure, PLEASE CONTACT YOUR DOCTOR.   Regional Anesthesia Blocks  1. Numbness or the inability to move the "blocked" extremity may last from 3-48 hours after placement. The length of time depends on the medication injected and your individual response to the medication. If the numbness is not going away after 48 hours, call your surgeon.  2. The extremity that is blocked will need to be protected until the numbness is gone and the  Strength has returned. Because you cannot feel it, you will need to take extra care to avoid injury. Because it may be weak, you may have difficulty moving it or using it. You may not know what position it is in without looking at it while the block is in effect.  3. For blocks in the legs and feet, returning to weight bearing and walking needs to be done carefully. You will need to wait until the numbness is entirely gone and the strength has returned. You should be able to move your leg and foot normally before you try and bear weight or walk. You will need someone to be with you when you first try to ensure you do not fall and possibly risk injury.  4. Bruising and tenderness at the needle site are common side effects and will resolve in a few days.  5. Persistent numbness or new problems with movement should be communicated to the surgeon or the [facility] [phone] [place] [phone]).  Post Anesthesia Home Care Instructions  Activity: Get plenty of rest for the remainder of the day. A responsible individual must stay with you for 24 hours following the procedure.  For the next 24 hours, DO NOT: -Drive a car -Advertising copywriterperate machinery -Drink alcoholic beverages -Take any medication unless instructed by your physician -Make any legal decisions or sign important papers.  Meals: Start with liquid foods such as gelatin or soup. Progress to regular foods as tolerated. Avoid greasy, spicy,  heavy foods. If nausea and/or vomiting occur, drink only clear liquids until the nausea and/or vomiting subsides. Call your physician if vomiting continues.  Special Instructions/Symptoms: Your throat may feel dry or sore from the anesthesia or the breathing tube placed in your throat during surgery. If this causes discomfort, gargle with warm salt water. The discomfort should disappear within 24 hours.  If you had a scopolamine patch placed behind your ear for the management of post- operative nausea and/or vomiting:  1. The medication in the patch is effective for 72 hours, after which it should be removed.  Wrap patch in a tissue and discard in the trash. Wash hands thoroughly with soap and water. 2. You may remove the patch earlier than 72 hours if you experience unpleasant side effects which may include dry mouth, dizziness or visual disturbances. 3. Avoid touching the patch. Wash your hands with soap and water after contact with the patch.

## 2019-03-07 ENCOUNTER — Encounter: Payer: Self-pay | Admitting: Orthopaedic Surgery

## 2019-03-07 ENCOUNTER — Telehealth: Payer: Self-pay | Admitting: Radiology

## 2019-03-07 NOTE — Telephone Encounter (Signed)
Patient and his mother called the triage line stating that the patient had surgery yesterday and he was complaining of numbness and tingling in his post op foot. I advised that since he had a nerve block that it is probably his nerves waking up from the block, that as it wakes up this should improve, however if it doesn't improve or if it worsens they should call back to the office and the answering service will get the provider on call to call him back to discuss the issue and to advise further, and he states that he understands.

## 2019-03-07 NOTE — Anesthesia Postprocedure Evaluation (Signed)
Anesthesia Post Note  Patient: Jerome Levine  Procedure(s) Performed: OPEN REDUCTION INTERNAL FIXATION (ORIF) LEFT LATERAL MALLEOLUS POSSIBLE SYNDESMOSIS (Left Ankle)     Patient location during evaluation: PACU Anesthesia Type: General Level of consciousness: awake and alert Pain management: pain level controlled Vital Signs Assessment: post-procedure vital signs reviewed and stable Respiratory status: spontaneous breathing, nonlabored ventilation, respiratory function stable and patient connected to nasal cannula oxygen Cardiovascular status: blood pressure returned to baseline and stable Postop Assessment: no apparent nausea or vomiting Anesthetic complications: no    Last Vitals:  Vitals:   03/06/19 1620 03/06/19 1644  BP:  122/76  Pulse: 66 84  Resp: 16 18  Temp:  37.3 C  SpO2: 99% 100%    Last Pain:  Vitals:   03/06/19 1644  TempSrc: Oral  PainSc: 6                  Montez Hageman

## 2019-03-11 ENCOUNTER — Encounter (HOSPITAL_BASED_OUTPATIENT_CLINIC_OR_DEPARTMENT_OTHER): Payer: Self-pay | Admitting: Orthopaedic Surgery

## 2019-03-13 ENCOUNTER — Telehealth: Payer: Self-pay | Admitting: Orthopaedic Surgery

## 2019-03-13 ENCOUNTER — Other Ambulatory Visit: Payer: Self-pay | Admitting: Physician Assistant

## 2019-03-13 MED ORDER — HYDROCODONE-ACETAMINOPHEN 5-325 MG PO TABS: 1.0000 | ORAL_TABLET | Freq: Three times a day (TID) | ORAL | 0 refills | Status: DC | PRN

## 2019-03-13 NOTE — Telephone Encounter (Signed)
Patient advised.

## 2019-03-13 NOTE — Telephone Encounter (Signed)
Please advise. Thanks.  

## 2019-03-13 NOTE — Telephone Encounter (Signed)
Ok, I called in norco.  If does not help enough, have him add 800mg  ibuprofen bid prn

## 2019-03-13 NOTE — Telephone Encounter (Signed)
I s/w patient and he said that without oxycodone pain is pretty severe in the mornings but he feels like surgery has helped some and pain is slowly improving but feels like he needs step down from oxycodone so he doesn't sleep so much. Uses [facility] in [place]

## 2019-03-13 NOTE — Telephone Encounter (Signed)
Patient called stated that the Oxycodone that was prescribed seems a little to strong for him. Stated each time he takes one even to a 1/2 puts him asleep and fighting to stay awake. Always makes him sleepy.  Please call patient to advise.  [phone]

## 2019-03-13 NOTE — Telephone Encounter (Signed)
We can try tramadol or norco.  Will you see what he thinks and how bad his pain is without oxy?

## 2019-03-20 ENCOUNTER — Ambulatory Visit (INDEPENDENT_AMBULATORY_CARE_PROVIDER_SITE_OTHER): Payer: Commercial Managed Care - PPO

## 2019-03-20 ENCOUNTER — Encounter: Payer: Self-pay | Admitting: Orthopaedic Surgery

## 2019-03-20 ENCOUNTER — Ambulatory Visit (INDEPENDENT_AMBULATORY_CARE_PROVIDER_SITE_OTHER): Payer: Commercial Managed Care - PPO | Admitting: Orthopaedic Surgery

## 2019-03-20 DIAGNOSIS — M25572 Pain in left ankle and joints of left foot: Secondary | ICD-10-CM

## 2019-03-20 DIAGNOSIS — S82892A Other fracture of left lower leg, initial encounter for closed fracture: Secondary | ICD-10-CM

## 2019-03-20 NOTE — Progress Notes (Signed)
   Post-Op Visit Note   Patient: Jerome Levine           Date of Birth: 06-13-1996           MRN: [id number] Visit Date: 03/20/2019 PCP: Jerome Levine:  Chief Complaint:  Chief Complaint  Patient presents with  . Left Ankle - Routine Post Op    Left ankle ORIF lat mall DOS 03/06/2019   Visit Diagnoses:  1. Closed left ankle fracture, initial encounter   2. Pain in left ankle and joints of left foot     Plan: [name] is two-week status post ORIF left fibula fracture and syndesmosis repair with tight rope.  He is doing well overall.  He has not been elevating as much as he should.  His surgical incision is clean dry and intact and healed.  No signs of infection or drainage.  We remove the sutures today apply Steri-Strips.  Cam walker was given today.  He understands the importance of elevation and ice during this time.  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  I will see him back and in 4 weeks with three-view x-rays of the left ankle.  Follow-Up Instructions: Return in about 4 weeks (around 04/17/2019).   Orders:  Orders Placed This Encounter  Procedures  . XR Ankle Complete Left   No orders of the defined types were placed in this encounter.   Imaging: Xr Ankle Complete Left  Result Date: 03/20/2019 Stable fixation and alignment of fibula fracture and ankle mortise.   PMFS History: Patient Active Problem List   Diagnosis Date Noted  . Closed left ankle fracture, initial encounter 03/04/2019  . Rectal bleeding 11/24/2018   History reviewed. No pertinent past medical history.  History reviewed. No pertinent family history.  Past Surgical History:  Procedure Laterality Date  . ORIF ANKLE FRACTURE Left 03/06/2019   Procedure: OPEN REDUCTION INTERNAL FIXATION (ORIF) LEFT LATERAL MALLEOLUS POSSIBLE SYNDESMOSIS;   Surgeon: Leandrew Koyanagi, MD;  Location: [facility];  Service: Orthopedics;  Laterality: Left;  . WISDOM TOOTH EXTRACTION     Social History   Occupational History  . Not on file  Tobacco Use  . Smoking status: Current Every Day Smoker    Packs/day: 0.25    Types: Cigarettes  . Smokeless tobacco: Former Network engineer and Sexual Activity  . Alcohol use: Yes    Comment: Socially   . Drug use: Yes    Types: Marijuana  . Sexual activity: Not on file

## 2019-03-24 ENCOUNTER — Telehealth: Payer: Self-pay | Admitting: Orthopaedic Surgery

## 2019-03-24 NOTE — Telephone Encounter (Signed)
Neither will help with inflammation.  He can increase frequency of hydrocodone to 1-2 q6-8 hours and add 800 mg ibuprofen tid.  Elevation and ice as much as possible to operative extremity is what will help swelling the most.  Ok to take off boot when sitting or lying to do the alphabet with ankle

## 2019-03-24 NOTE — Telephone Encounter (Signed)
Pt called in requesting to be switched back to oxycodone instead of hydrocodone pt says he is on hydrocodone and that medication isn't helping with swelling or pain and its difficult to sleep at night. Pt is also wondering when he doesn't have the boot on can he start trying to move his ankle? [facility] [place]   [phone]

## 2019-03-24 NOTE — Telephone Encounter (Signed)
Please advise 

## 2019-03-24 NOTE — Telephone Encounter (Signed)
I spoke with patient. He has already spoken with someone here who gave him Jerome Levine's advise. Patient verbalized understanding.

## 2019-04-01 ENCOUNTER — Other Ambulatory Visit: Payer: Self-pay | Admitting: Physician Assistant

## 2019-04-01 ENCOUNTER — Telehealth: Payer: Self-pay | Admitting: Orthopaedic Surgery

## 2019-04-01 MED ORDER — HYDROCODONE-ACETAMINOPHEN 5-325 MG PO TABS: 1.0000 | ORAL_TABLET | Freq: Two times a day (BID) | ORAL | 0 refills | Status: DC | PRN

## 2019-04-01 NOTE — Telephone Encounter (Signed)
I called patient and advised. 

## 2019-04-01 NOTE — Telephone Encounter (Signed)
Will you make sure he is not having symptoms of dvt like calf pain, etc

## 2019-04-01 NOTE — Telephone Encounter (Signed)
I have called in norco.  Use sparingly.  Continue nsaids and continue to ice and elevate.  Make sure ankle above the level of the heart

## 2019-04-01 NOTE — Telephone Encounter (Signed)
Patient only has pain and swelling in his ankle. He denies any swelling higher in leg and denies calf pain.

## 2019-04-01 NOTE — Telephone Encounter (Signed)
I left voicemail requesting return call from patient.

## 2019-04-01 NOTE — Telephone Encounter (Signed)
Patient returned your call.    Thank you.

## 2019-04-01 NOTE — Telephone Encounter (Signed)
I called and spoke with patient. He states that he continues to have a lot of swelling even though he is elevating above his heart and using ice. He is also out of the hydrocodone and requests refill.  He is taking one of those and a small dose of ibuprofen which is helping with the pain. He would like to know if there is anything else that you would like for him to do in regards to swelling.  Please advise.

## 2019-04-01 NOTE — Telephone Encounter (Signed)
Patient called. Is having a lot of pain still. Would like someone to call him. Call back number is [phone]. Thanks.

## 2019-04-09 ENCOUNTER — Other Ambulatory Visit: Payer: Self-pay | Admitting: Orthopaedic Surgery

## 2019-04-09 NOTE — Telephone Encounter (Signed)
no

## 2019-04-09 NOTE — Telephone Encounter (Signed)
Continue

## 2019-04-17 ENCOUNTER — Encounter: Payer: Self-pay | Admitting: Orthopaedic Surgery

## 2019-04-17 ENCOUNTER — Ambulatory Visit: Payer: Self-pay

## 2019-04-17 ENCOUNTER — Ambulatory Visit (INDEPENDENT_AMBULATORY_CARE_PROVIDER_SITE_OTHER): Payer: Commercial Managed Care - PPO | Admitting: Orthopaedic Surgery

## 2019-04-17 DIAGNOSIS — S82892A Other fracture of left lower leg, initial encounter for closed fracture: Secondary | ICD-10-CM | POA: Diagnosis not present

## 2019-04-17 NOTE — Progress Notes (Signed)
   Post-Op Visit Note   Patient: Jerome Levine           Date of Birth: [birth year]           MRN: [id number] Visit Date: 04/17/2019 PCP: [facility]:  Chief Complaint:  Chief Complaint  Patient presents with  . Left Ankle - Routine Post Op   Visit Diagnoses:  1. Closed left ankle fracture, initial encounter     Plan: Clerance is 6 weeks status post ORIF left Weber C ankle fracture with syndesmosis injury.  He is doing well overall and denies any pain.  He still has some mild swelling.  Surgical scar is fully healed.  He has no complaints otherwise.  At this point we will write advance him to touchdown weightbearing to the left lower extremity.  Prescription for outpatient PT was provided today.  Recheck in 4 weeks with three-view x-rays of the left ankle.  He needs to continue to remain out of work due to activity restrictions.  Follow-Up Instructions: Return in about 4 weeks (around 05/15/2019).   Orders:  Orders Placed This Encounter  Procedures  . XR Ankle Complete Left   No orders of the defined types were placed in this encounter.   Imaging: Xr Ankle Complete Left  Result Date: 04/17/2019 Stable plate fixation of fibula fracture with callus formation calcification of the syndesmosis.   PMFS History: Patient Active Problem List   Diagnosis Date Noted  . Closed left ankle fracture, initial encounter 03/04/2019  . Rectal bleeding 11/24/2018   History reviewed. No pertinent past medical history.  History reviewed. No pertinent family history.  Past Surgical History:  Procedure Laterality Date  . ORIF ANKLE FRACTURE Left 03/06/2019   Procedure: OPEN REDUCTION INTERNAL FIXATION (ORIF) LEFT LATERAL MALLEOLUS POSSIBLE SYNDESMOSIS;  Surgeon: Leandrew Koyanagi, MD;  Location: [facility];  Service: Orthopedics;  Laterality: Left;  . WISDOM TOOTH EXTRACTION     Social History   Occupational History  . Not on file   Tobacco Use  . Smoking status: Current Every Day Smoker    Packs/day: 0.25    Types: Cigarettes  . Smokeless tobacco: Former Network engineer and Sexual Activity  . Alcohol use: Yes    Comment: Socially   . Drug use: Yes    Types: Marijuana  . Sexual activity: Not on file

## 2019-04-24 ENCOUNTER — Telehealth: Payer: Self-pay | Admitting: Orthopedic Surgery

## 2019-04-24 NOTE — Telephone Encounter (Signed)
Patient called. Says he needs a note for work. The one he has ends 10/10 but he still can not put weight on his leg. Would like a note extending his time out of work. His call back number is [phone]

## 2019-04-25 NOTE — Telephone Encounter (Signed)
Note ready for pick up at the front desk. Patient aware. ? ?

## 2019-04-25 NOTE — Telephone Encounter (Signed)
See message.

## 2019-04-25 NOTE — Telephone Encounter (Signed)
Ok extend until follow up

## 2019-05-15 ENCOUNTER — Ambulatory Visit (INDEPENDENT_AMBULATORY_CARE_PROVIDER_SITE_OTHER): Payer: Commercial Managed Care - PPO

## 2019-05-15 ENCOUNTER — Ambulatory Visit (INDEPENDENT_AMBULATORY_CARE_PROVIDER_SITE_OTHER): Payer: Commercial Managed Care - PPO | Admitting: Physician Assistant

## 2019-05-15 ENCOUNTER — Other Ambulatory Visit: Payer: Self-pay

## 2019-05-15 ENCOUNTER — Encounter: Payer: Self-pay | Admitting: Physician Assistant

## 2019-05-15 DIAGNOSIS — S82892D Other fracture of left lower leg, subsequent encounter for closed fracture with routine healing: Secondary | ICD-10-CM | POA: Diagnosis not present

## 2019-05-15 MED ORDER — TRAMADOL HCL 50 MG PO TABS: 50.0000 mg | ORAL_TABLET | Freq: Four times a day (QID) | ORAL | 1 refills | Status: DC | PRN

## 2019-05-15 NOTE — Progress Notes (Signed)
   Post-Op Visit Note   Patient: Jerome Levine           Date of Birth: [birth year]           MRN: [id number] Visit Date: 05/15/2019 PCP: [facility]:  Chief Complaint:  Chief Complaint  Patient presents with  . Left Ankle - Follow-up, Pain   Visit Diagnoses:  1. Closed fracture of left ankle with routine healing, subsequent encounter     Plan: Patient is a pleasant 23 year old who presents to our clinic today 10 weeks status post ORIF left lateral malleolus fracture and syndesmosis fixation with tight rope, date of surgery 03/06/2019.  He has been doing fairly well.  He has been somewhat compliant touchdown weightbearing but notes that he has not been wearing his boot for the past week.  He also admits that he has put full weight on his foot at times.  He has not yet been to outpatient physical therapy.  Examination of his left ankle reveals a fully healed surgical scar without complications.  No bony tenderness.  Somewhat limited range of motion secondary to stiffness.  Mild swelling.  Calf is soft and nontender.  He is neurovascularly intact distally.  At this point, will allow him to be full weightbearing in a cam walker and will transition into an ASO over the next few weeks.  I have encouraged him to go ahead and start formal physical therapy.  He will follow-up with [place] in 4 weeks time for repeat evaluation and 3 view x-rays of the left ankle.  Call with concerns or questions in the meantime.  Follow-Up Instructions: Return in about 4 weeks (around 06/12/2019).   Orders:  Orders Placed This Encounter  Procedures  . XR Ankle Complete Left   No orders of the defined types were placed in this encounter.   Imaging: Xr Ankle Complete Left  Result Date: 05/15/2019 X-rays demonstrate stable fixation of the fracture with significant bony callus formation   PMFS History: Patient Active Problem List   Diagnosis Date Noted  . Closed left  ankle fracture, initial encounter 03/04/2019  . Rectal bleeding 11/24/2018   History reviewed. No pertinent past medical history.  History reviewed. No pertinent family history.  Past Surgical History:  Procedure Laterality Date  . ORIF ANKLE FRACTURE Left 03/06/2019   Procedure: OPEN REDUCTION INTERNAL FIXATION (ORIF) LEFT LATERAL MALLEOLUS POSSIBLE SYNDESMOSIS;  Surgeon: Leandrew Koyanagi, MD;  Location: [facility];  Service: Orthopedics;  Laterality: Left;  . WISDOM TOOTH EXTRACTION     Social History   Occupational History  . Not on file  Tobacco Use  . Smoking status: Current Every Day Smoker    Packs/day: 0.25    Types: Cigarettes  . Smokeless tobacco: Former Network engineer and Sexual Activity  . Alcohol use: Yes    Comment: Socially   . Drug use: Yes    Types: Marijuana  . Sexual activity: Not on file

## 2019-05-23 ENCOUNTER — Telehealth: Payer: Self-pay | Admitting: Physician Assistant

## 2019-05-23 ENCOUNTER — Telehealth: Payer: Self-pay | Admitting: Orthopaedic Surgery

## 2019-05-23 NOTE — Telephone Encounter (Signed)
Note ready and at front for pick up. Patient aware.

## 2019-05-23 NOTE — Telephone Encounter (Signed)
[place] for work note until f/u appt

## 2019-05-23 NOTE — Telephone Encounter (Signed)
Patient called back advised if he do not have the note to return to work today he will be fired. Please see previous note. The number to contact patient is [phone]

## 2019-05-23 NOTE — Telephone Encounter (Signed)
Patient called back again stating that his employer needs this note today or he will not keep his job.  Please call him when the note is ready.  Thank you.

## 2019-05-23 NOTE — Telephone Encounter (Signed)
Pt called in said he needs a work note today asap for return to work today 10/30 for light duty light walking, and no lighting.    [phone]

## 2019-05-23 NOTE — Telephone Encounter (Signed)
Note completed and at front for pick up. Patient advised.

## 2019-06-12 ENCOUNTER — Other Ambulatory Visit: Payer: Self-pay

## 2019-06-12 ENCOUNTER — Ambulatory Visit (INDEPENDENT_AMBULATORY_CARE_PROVIDER_SITE_OTHER): Payer: Commercial Managed Care - PPO

## 2019-06-12 ENCOUNTER — Encounter: Payer: Self-pay | Admitting: Orthopaedic Surgery

## 2019-06-12 ENCOUNTER — Ambulatory Visit (INDEPENDENT_AMBULATORY_CARE_PROVIDER_SITE_OTHER): Payer: Commercial Managed Care - PPO | Admitting: Orthopaedic Surgery

## 2019-06-12 DIAGNOSIS — S82892D Other fracture of left lower leg, subsequent encounter for closed fracture with routine healing: Secondary | ICD-10-CM

## 2019-06-12 NOTE — Progress Notes (Signed)
   Office Visit Note   Patient: Jerome Levine           Date of Birth: Aug 27, 1995           MRN: [id number] Visit Date: 06/12/2019              Requested by: [facility], [place],  [place] [id number] PCP: [facility]: Visit Diagnoses:  1. Closed fracture of left ankle with routine healing, subsequent encounter     Plan: Jerome Levine is overall doing well.  His x-rays demonstrate significant healing today.  He is clinically improving as well.  I do not feel that he is ready for full duty quite yet.  I would like him to get a little bit stronger rehab a little bit more before released to do that.  We will continue his restrictions for another 6weeks.  Exercises were demonstrated to him today.  Follow-up in 6 weeks with three-view x-rays of the left ankle.  Follow-Up Instructions: Return in about 6 weeks (around 07/24/2019).   Orders:  Orders Placed This Encounter  Procedures  . XR Ankle Complete Left   No orders of the defined types were placed in this encounter.     Procedures: No procedures performed   Clinical Data: No additional findings.   Subjective: Chief Complaint  Patient presents with  . Left Ankle - Pain, Follow-up    Jerome Levine is 98 days status post ORIF Weber C ankle fracture and syndesmosis injury.  Jerome Levine is doing well overall.  He does endorse stiffness and soreness and swelling.  He is currently on light duty.  He takes Advil Aleve for his symptoms.  He is quite ready for full duty.  He is unable to afford outpatient PT.     Review of Systems   Objective: Vital Signs: There were no vitals taken for this visit.  Physical Exam  Ortho Exam Left ankle exam shows fully healed surgical scar.  He does have moderate swelling.  He is moderate limitation with ankle dorsiflexion.  He has no pain. Specialty Comments:  No specialty comments available.  Imaging: Xr Ankle Complete Left  Result Date: 06/12/2019 Stable fixation alignment of ankle fracture with abundant callus formation.  Ankle mortise is congruent    PMFS History: Patient Active Problem List   Diagnosis Date Noted  . Closed left ankle fracture, initial encounter 03/04/2019  . Rectal bleeding 11/24/2018   History reviewed. No pertinent past medical history.  History reviewed. No pertinent family history.  Past Surgical History:  Procedure Laterality Date  . ORIF ANKLE FRACTURE Left 03/06/2019   Procedure: OPEN REDUCTION INTERNAL FIXATION (ORIF) LEFT LATERAL MALLEOLUS POSSIBLE SYNDESMOSIS;  Surgeon: Leandrew Koyanagi, MD;  Location: [facility];  Service: Orthopedics;  Laterality: Left;  . WISDOM TOOTH EXTRACTION     Social History   Occupational History  . Not on file  Tobacco Use  . Smoking status: Current Every Day Smoker    Packs/day: 0.25    Types: Cigarettes  . Smokeless tobacco: Former Network engineer and Sexual Activity  . Alcohol use: Yes    Comment: Socially   . Drug use: Yes    Types: Marijuana  . Sexual activity: Not on file

## 2019-07-29 ENCOUNTER — Ambulatory Visit: Payer: Commercial Managed Care - PPO | Admitting: Orthopaedic Surgery

## 2020-04-26 DIAGNOSIS — K600 Acute anal fissure: Secondary | ICD-10-CM

## 2020-04-26 DIAGNOSIS — F1010 Alcohol abuse, uncomplicated: Secondary | ICD-10-CM

## 2020-04-26 DIAGNOSIS — F17200 Nicotine dependence, unspecified, uncomplicated: Secondary | ICD-10-CM

## 2020-04-26 HISTORY — DX: Alcohol abuse, uncomplicated: F10.10

## 2020-04-26 HISTORY — DX: Acute anal fissure: K60.0

## 2020-04-26 HISTORY — DX: Nicotine dependence, unspecified, uncomplicated: F17.200

## 2020-08-20 ENCOUNTER — Encounter: Payer: Self-pay | Admitting: Cardiology

## 2020-08-20 ENCOUNTER — Encounter: Payer: Self-pay | Admitting: *Deleted

## 2020-08-20 DIAGNOSIS — R945 Abnormal results of liver function studies: Secondary | ICD-10-CM | POA: Insufficient documentation

## 2020-08-20 DIAGNOSIS — F32A Depression, unspecified: Secondary | ICD-10-CM | POA: Insufficient documentation

## 2020-08-20 DIAGNOSIS — R0789 Other chest pain: Secondary | ICD-10-CM | POA: Insufficient documentation

## 2020-08-20 DIAGNOSIS — F419 Anxiety disorder, unspecified: Secondary | ICD-10-CM | POA: Insufficient documentation

## 2020-08-20 DIAGNOSIS — R079 Chest pain, unspecified: Secondary | ICD-10-CM | POA: Insufficient documentation

## 2020-08-27 ENCOUNTER — Other Ambulatory Visit: Payer: Self-pay

## 2020-08-27 ENCOUNTER — Ambulatory Visit: Payer: Commercial Managed Care - PPO | Admitting: Cardiology

## 2020-08-27 ENCOUNTER — Encounter: Payer: Self-pay | Admitting: Cardiology

## 2020-08-27 VITALS — BP 124/58 | HR 66 | Ht 70.0 in | Wt 213.0 lb

## 2020-08-27 DIAGNOSIS — F17200 Nicotine dependence, unspecified, uncomplicated: Secondary | ICD-10-CM

## 2020-08-27 DIAGNOSIS — R0789 Other chest pain: Secondary | ICD-10-CM

## 2020-08-27 DIAGNOSIS — I493 Ventricular premature depolarization: Secondary | ICD-10-CM

## 2020-08-27 DIAGNOSIS — R001 Bradycardia, unspecified: Secondary | ICD-10-CM

## 2020-08-27 HISTORY — DX: Bradycardia, unspecified: R00.1

## 2020-08-27 HISTORY — DX: Ventricular premature depolarization: I49.3

## 2020-08-27 LAB — LIPID PANEL
Chol/HDL Ratio: 3.4 ratio (ref 0.0–5.0)
Cholesterol, Total: 166 mg/dL (ref 100–199)
HDL: 49 mg/dL (ref >39)
LDL Chol Calc (NIH): 96 mg/dL (ref 0–99)
Triglycerides: 118 mg/dL (ref 0–149)
VLDL Cholesterol Cal: 21 mg/dL (ref 5–40)

## 2020-08-27 LAB — TSH: TSH: 1.62 u[IU]/mL (ref 0.450–4.500)

## 2020-08-27 NOTE — Addendum Note (Signed)
Addended by: Reynolds Bowl on: 08/27/2020 11:10 AM   Modules accepted: Orders

## 2020-08-27 NOTE — Progress Notes (Signed)
Cardiology Consultation:    Date:  08/27/2020   ID:  Jerome Levine, DOB [birth year], MRN [id number]  PCP:  [facility]  Cardiologist:  Gypsy Balsam, MD   Referring MD: Eber Jones, NP   Chief Complaint  Patient presents with  . Irregular Heart Beat    History of Present Illness:    Jerome Levine is a 25 y.o. male who is being seen today for the evaluation of bradycardia and extrasystole at the request of Eber Jones, NP.  He is a young man with no significant past medical history.  He was a smoker as well as drinker however few months ago he stopped dose.  The reason why he stopped it is the fact that investigation of his heart show some abnormalities.  He described to have a chest pain pain typically happen at rest he said it is only mild 2 in scale scale up to 10 getting better when he stretch it does not happen with exercise.  He described a sensation of sharp lasting for few minutes.  It does not happen with exercise.  He works in [employer] that involve physical work and he has no difficulty doing it.  He actually told me that he thinks is getting better and stronger he is able to do more right now with no difficulties. Does not have hypertension He never had his cholesterol checked Used to smoke about a pack per day then cut down to 3 to 4 cigarettes in about a month ago he completely stopped.  He used to dip however. He used to drink alcohol quite heavily however stopped about a month ago. Does smoke marijuana occasionally. His mother apparently does have a lung cancer which is related probably to smoking.  She also has some heart issue.  He does have a sister who is older who was morbidly obese and does have atrial fibrillation.  She did have some gastric bypass surgery and after that lost significant amount of weight and now she is doing much better.  Past Medical History:  Diagnosis Date  . Abnormal results of liver function studies    . Acute posterior anal fissure 04/26/2020  . Alcohol abuse, episodic drinking behavior 04/26/2020  . Anxiety disorder   . Chest pain   . Closed left ankle fracture, initial encounter 03/04/2019  . Depression   . Rectal bleeding 11/24/2018  . Tobacco dependence 04/26/2020    Past Surgical History:  Procedure Laterality Date  . ORIF ANKLE FRACTURE Left 03/06/2019   Procedure: OPEN REDUCTION INTERNAL FIXATION (ORIF) LEFT LATERAL MALLEOLUS POSSIBLE SYNDESMOSIS;  Surgeon: Tarry Kos, MD;  Location: [facility];  Service: Orthopedics;  Laterality: Left;  . WISDOM TOOTH EXTRACTION      Current Medications: Current Meds  Medication Sig  . Cholecalciferol (VITAMIN D3) 1.25 MG (50000 UT) CAPS Take 1 tablet by mouth every 7 (seven) days.     Allergies:   Patient has no known allergies.   Social History   Socioeconomic History  . Marital status: Single    Spouse name: Not on file  . Number of children: Not on file  . Years of education: Not on file  . Highest education level: Not on file  Occupational History  . Not on file  Tobacco Use  . Smoking status: Current Every Day Smoker    Packs/day: 0.25    Types: Cigarettes  . Smokeless tobacco: Former Engineer, water and Sexual Activity  .  Alcohol use: Yes    Comment: Socially   . Drug use: Yes    Types: Marijuana  . Sexual activity: Not on file  Other Topics Concern  . Not on file  Social History Narrative  . Not on file   Social Determinants of Health   Financial Resource Strain: Not on file  Food Insecurity: Not on file  Transportation Needs: Not on file  Physical Activity: Not on file  Stress: Not on file  Social Connections: Not on file     Family History: The patient's family history includes COPD in his mother; Lung cancer in his mother. ROS:   Please see the history of present illness.    All 14 point review of systems negative except as described per history of present illness.  EKGs/Labs/Other  Studies Reviewed:    The following studies were reviewed today: I did review Holter monitor which showed slowest heart rate of 33, average heart rate 70, maximum heart rate was 160.  There was some PVCs present.  The mechanism of bradycardia was sinus and it happened mostly during the night.  EKG:  EKG is  ordered today.  The ekg ordered today demonstrates sinus rhythm rate of 62, normal PR interval, normal QS complex duration folds, no ST segment changes.  Recent Labs: No results found for requested labs within last 8760 hours.  Recent Lipid Panel No results found for: CHOL, TRIG, HDL, CHOLHDL, VLDL, LDLCALC, LDLDIRECT  Physical Exam:    VS:  BP (!) 124/58 (BP Location: Left Arm, Patient Position: Sitting)   Pulse 66   Ht 5\' 10"  (1.778 m)   Wt 213 lb (96.6 kg)   SpO2 98%   BMI 30.56 kg/m     Wt Readings from Last 3 Encounters:  08/27/20 213 lb (96.6 kg)  08/06/20 205 lb (93 kg)  03/06/19 210 lb 5.1 oz (95.4 kg)     GEN:  Well nourished, well developed in no acute distress HEENT: Normal NECK: No JVD; No carotid bruits LYMPHATICS: No lymphadenopathy CARDIAC: RRR, no murmurs, no rubs, no gallops RESPIRATORY:  Clear to auscultation without rales, wheezing or rhonchi  ABDOMEN: Soft, non-tender, non-distended MUSCULOSKELETAL:  No edema; No deformity  SKIN: Warm and dry NEUROLOGIC:  Alert and oriented x 3 PSYCHIATRIC:  Normal affect   ASSESSMENT:    1. Atypical chest pain   2. Sinus bradycardia   3. PVC's (premature ventricular contractions)   4. Tobacco dependence    PLAN:    In order of problems listed above:  1. Atypical chest pain I do not think is related to his heart.  Happening at rest only mild overall getting better.  We will not investigate coronary artery disease at this stage since I doubt very much she have problem like that. 2. Sinus bradycardia during the night down to 33, completely asymptomatic.  At the same time he works hard physically so he is  athletic since he is completely asymptomatic I will only check his TSH make sure he is not hypothyroid take and then we will simply monitoring that.  I told him if he still getting dizzy or passing out he need to let me know immediately. 3. PVCs noted on the monitor.  I will ask him to have echocardiogram to assess left ventricle ejection fraction. 4. Smoking he quit and I congratulated him for it.  He is dipping.  I told him to stop. 5. Overall risk factors modifications I asked him to have fasting lipid profile  today we did talk about healthy lifestyle need to quit dipping, he already quit smoking as well as drinking which is excellent.   Medication Adjustments/Labs and Tests Ordered: Current medicines are reviewed at length with the patient today.  Concerns regarding medicines are outlined above.  No orders of the defined types were placed in this encounter.  No orders of the defined types were placed in this encounter.   Signed, Georgeanna Lea, MD, [facility]. 08/27/2020 10:38 AM    [facility]

## 2020-08-27 NOTE — Addendum Note (Signed)
Addended by: Reynolds Bowl on: 08/27/2020 10:53 AM   Modules accepted: Orders

## 2020-08-27 NOTE — Patient Instructions (Signed)
Medication Instructions:  Your physician recommends that you continue on your current medications as directed. Please refer to the Current Medication list given to you today.  *If you need a refill on your cardiac medications before your next appointment, please call your pharmacy*   Lab Work: Your physician recommends that you return for lab work: TODAY LIPIDS, TSH  If you have labs (blood work) drawn today and your tests are completely normal, you will receive your results only by: Marland Kitchen MyChart Message (if you have MyChart) OR . A paper copy in the mail If you have any lab test that is abnormal or we need to change your treatment, we will call you to review the results.   Testing/Procedures: Your physician has requested that you have an echocardiogram. Echocardiography is a painless test that uses sound waves to create images of your heart. It provides your doctor with information about the size and shape of your heart and how well your heart's chambers and valves are working. This procedure takes approximately one hour. There are no restrictions for this procedure.   Follow-Up: At [facility], you and your health needs are our priority.  As part of our continuing mission to provide you with exceptional heart care, we have created designated Provider Care Teams.  These Care Teams include your primary Cardiologist (physician) and Advanced Practice Providers (APPs -  Physician Assistants and Nurse Practitioners) who all work together to provide you with the care you need, when you need it.  We recommend signing up for the patient portal called "MyChart".  Sign up information is provided on this After Visit Summary.  MyChart is used to connect with patients for Virtual Visits (Telemedicine).  Patients are able to view lab/test results, encounter notes, upcoming appointments, etc.  Non-urgent messages can be sent to your provider as well.   To learn more about what you can do with MyChart, go  to [email or web address].    Your next appointment:   6 week(s)  The format for your next appointment:   In Person  Provider:   Gypsy Balsam, MD   Other Instructions  Echocardiogram An echocardiogram is a test that uses sound waves (ultrasound) to produce images of the heart. Images from an echocardiogram can provide important information about:  Heart size and shape.  The size and thickness and movement of your heart's walls.  Heart muscle function and strength.  Heart valve function or if you have stenosis. Stenosis is when the heart valves are too narrow.  If blood is flowing backward through the heart valves (regurgitation).  A tumor or infectious growth around the heart valves.  Areas of heart muscle that are not working well because of poor blood flow or injury from a heart attack.  Aneurysm detection. An aneurysm is a weak or damaged part of an artery wall. The wall bulges out from the normal force of blood pumping through the body. Tell a health care provider about:  Any allergies you have.  All medicines you are taking, including vitamins, herbs, eye drops, creams, and over-the-counter medicines.  Any blood disorders you have.  Any surgeries you have had.  Any medical conditions you have.  Whether you are pregnant or may be pregnant. What are the risks? Generally, this is a safe test. However, problems may occur, including an allergic reaction to dye (contrast) that may be used during the test. What happens before the test? No specific preparation is needed. You may eat and drink normally. What happens  during the test?  You will take off your clothes from the waist up and put on a hospital gown.  Electrodes or electrocardiogram (ECG)patches may be placed on your chest. The electrodes or patches are then connected to a device that monitors your heart rate and rhythm.  You will lie down on a table for an ultrasound exam. A gel will be applied to  your chest to help sound waves pass through your skin.  A handheld device, called a transducer, will be pressed against your chest and moved over your heart. The transducer produces sound waves that travel to your heart and bounce back (or "echo" back) to the transducer. These sound waves will be captured in real-time and changed into images of your heart that can be viewed on a video monitor. The images will be recorded on a computer and reviewed by your health care provider.  You may be asked to change positions or hold your breath for a short time. This makes it easier to get different views or better views of your heart.  In some cases, you may receive contrast through an IV in one of your veins. This can improve the quality of the pictures from your heart. The procedure may vary among health care providers and hospitals.   What can I expect after the test? You may return to your normal, everyday life, including diet, activities, and medicines, unless your health care provider tells you not to do that. Follow these instructions at home:  It is up to you to get the results of your test. Ask your health care provider, or the department that is doing the test, when your results will be ready.  Keep all follow-up visits. This is important. Summary  An echocardiogram is a test that uses sound waves (ultrasound) to produce images of the heart.  Images from an echocardiogram can provide important information about the size and shape of your heart, heart muscle function, heart valve function, and other possible heart problems.  You do not need to do anything to prepare before this test. You may eat and drink normally.  After the echocardiogram is completed, you may return to your normal, everyday life, unless your health care provider tells you not to do that. This information is not intended to replace advice given to you by your health care provider. Make sure you discuss any questions you have  with your health care provider. Document Revised: 03/02/2020 Document Reviewed: 03/02/2020 Elsevier Patient Education  2021 [employer].

## 2020-10-01 ENCOUNTER — Encounter: Payer: Self-pay | Admitting: Cardiology

## 2020-10-01 ENCOUNTER — Other Ambulatory Visit: Payer: Self-pay

## 2020-10-01 ENCOUNTER — Ambulatory Visit (INDEPENDENT_AMBULATORY_CARE_PROVIDER_SITE_OTHER): Payer: Commercial Managed Care - PPO

## 2020-10-01 DIAGNOSIS — F17200 Nicotine dependence, unspecified, uncomplicated: Secondary | ICD-10-CM | POA: Diagnosis not present

## 2020-10-01 DIAGNOSIS — R001 Bradycardia, unspecified: Secondary | ICD-10-CM

## 2020-10-01 DIAGNOSIS — I493 Ventricular premature depolarization: Secondary | ICD-10-CM | POA: Diagnosis not present

## 2020-10-01 DIAGNOSIS — R0789 Other chest pain: Secondary | ICD-10-CM

## 2020-10-01 LAB — ECHOCARDIOGRAM COMPLETE
Area-P 1/2: 4.57 cm2
S' Lateral: 3.3 cm

## 2020-10-01 NOTE — Progress Notes (Signed)
Complete echocardiogram performed.  Jimmy Isadore Bokhari RDCS, RVT  

## 2020-10-11 ENCOUNTER — Ambulatory Visit: Payer: Commercial Managed Care - PPO | Admitting: Cardiology

## 2020-10-14 DIAGNOSIS — R079 Chest pain, unspecified: Secondary | ICD-10-CM | POA: Insufficient documentation

## 2020-10-15 ENCOUNTER — Encounter: Payer: Self-pay | Admitting: Cardiology

## 2020-10-15 ENCOUNTER — Ambulatory Visit: Payer: Commercial Managed Care - PPO | Admitting: Cardiology

## 2020-10-15 ENCOUNTER — Other Ambulatory Visit: Payer: Self-pay

## 2020-10-15 VITALS — BP 118/54 | HR 62 | Ht 70.0 in | Wt 202.8 lb

## 2020-10-15 DIAGNOSIS — F17200 Nicotine dependence, unspecified, uncomplicated: Secondary | ICD-10-CM | POA: Diagnosis not present

## 2020-10-15 DIAGNOSIS — R001 Bradycardia, unspecified: Secondary | ICD-10-CM | POA: Diagnosis not present

## 2020-10-15 DIAGNOSIS — R072 Precordial pain: Secondary | ICD-10-CM

## 2020-10-15 DIAGNOSIS — I493 Ventricular premature depolarization: Secondary | ICD-10-CM

## 2020-10-15 NOTE — Patient Instructions (Signed)

## 2020-10-15 NOTE — Progress Notes (Signed)
Cardiology Office Note:    Date:  10/15/2020   ID:  Jerome Levine, DOB 09/29/1995, MRN [id number]  PCP:  [facility]  Cardiologist:  Gypsy Balsam, MD    Referring MD: [facility]   No chief complaint on file. I am doing fine but I do have some palpitations  History of Present Illness:    Jerome Levine is a 25 y.o. male with past medical history significant for some premature extra beats, atypical chest pain, bradycardia with heart rate of 33.  He was referred to [place] initially for those symptoms.  Quite extensive evaluation has been done which included echocardiogram showing normal left ventricle ejection fraction.  Reports that enlarged left atrium but overall volume is normal.  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  That lasts only for short periods of time. Denies have any dizziness or passing out Described to have some palpitations lately which he correctly is absent strong forceful beating in his chest.  Past Medical History:  Diagnosis Date  . Abnormal results of liver function studies   . Acute posterior anal fissure 04/26/2020  . Alcohol abuse, episodic drinking behavior 04/26/2020  . Anxiety disorder   . Atypical chest pain   . Chest pain   . Closed left ankle fracture, initial encounter 03/04/2019  . Depression   . PVC's (premature ventricular contractions) 08/27/2020  . Rectal bleeding 11/24/2018  . Sinus bradycardia 08/27/2020  . Tobacco dependence 04/26/2020    Past Surgical History:  Procedure Laterality Date  . ORIF ANKLE FRACTURE Left 03/06/2019   Procedure: OPEN REDUCTION INTERNAL FIXATION (ORIF) LEFT LATERAL MALLEOLUS POSSIBLE SYNDESMOSIS;  Surgeon: Tarry Kos, MD;  Location: [facility];  Service: Orthopedics;  Laterality: Left;  . WISDOM TOOTH EXTRACTION      Current Medications: Current Meds  Medication Sig  . Cholecalciferol (VITAMIN D3) 1.25 MG (50000 UT) CAPS Take 1 tablet by mouth every 7 (seven) days.     Allergies:   Patient has no known allergies.   Social History   Socioeconomic History  . Marital status: Single    Spouse name: Not on file  . Number of children: Not on file  . Years of education: Not on file  . Highest education level: Not on file  Occupational History  . Not on file  Tobacco Use  . Smoking status: Current Every Day Smoker    Packs/day: 0.25    Types: Cigarettes  . Smokeless tobacco: Former Engineer, water and Sexual Activity  . Alcohol use: Yes    Comment: Socially   . Drug use: Yes    Types: Marijuana  . Sexual activity: Not on file  Other Topics Concern  . Not on file  Social History Narrative  . Not on file   Social Determinants of Health   Financial Resource Strain: Not on file  Food Insecurity: Not on file  Transportation Needs: Not on file  Physical Activity: Not on file  Stress: Not on file  Social Connections: Not on file     Family History: The patient's family history includes COPD in his mother; Lung cancer in his mother. ROS:   Please see the history of present illness.  All 14 point review of systems negative except as described per history of present illness  EKGs/Labs/Other Studies Reviewed:      Recent Labs: 08/27/2020: TSH 1.620  Recent Lipid Panel    Component Value Date/Time   CHOL 166 08/27/2020 1058   TRIG 118 08/27/2020 1058   HDL 49 08/27/2020 1058   CHOLHDL 3.4 08/27/2020 1058   LDLCALC 96 08/27/2020 1058    Physical Exam:    VS:  BP (!) 118/54   Pulse 62   Ht 5\' 10"  (1.778 m)   Wt 202 lb 12.8 oz (92 kg)   SpO2 97%   BMI 29.10 kg/m     Wt Readings from Last 3 Encounters:  10/15/20 202 lb 12.8 oz (92 kg)  08/27/20 213 lb (96.6 kg)  08/06/20 205 lb (93 kg)      GEN:  Well nourished, well developed in no acute distress HEENT: Normal NECK: No JVD; No carotid bruits LYMPHATICS: No lymphadenopathy CARDIAC: RRR, no murmurs, no rubs, no gallops RESPIRATORY:  Clear to auscultation without rales, wheezing or rhonchi  ABDOMEN: Soft, non-tender, non-distended MUSCULOSKELETAL:  No edema; No deformity  SKIN: Warm and dry LOWER EXTREMITIES: no swelling NEUROLOGIC:  Alert and oriented x 3 PSYCHIATRIC:  Normal affect   ASSESSMENT:    1. Sinus bradycardia   2. PVC's (premature ventricular contractions)   3. Tobacco dependence   4. Precordial pain    PLAN:    In order of problems listed above:  1. Sinus bradycardia.  Nothing critical asymptomatic.  He reports to have some rare palpitations he does become more frequent we will may be forced to do another monitor. 2. PVCs and again if that became a problem monitor will be placed to verify again if only thing we dealing with PVCs.  And investigate the may be well for some beta-blockers.  Likely his left ventricle ejection fraction is normal based on echocardiogram. 3. Tobacco abuse: He almost quit completely and I recommend to stay away from it. 4. Precordial chest pain very atypical.  Worse with certain motion.  I think is musculoskeletal pain.  We did talk about risk factors modification he changed dramatically his diet to regular encouraged him to do that.  On top of that we did his cholesterol which show LDL at 99.  I recommend complete discontinuation of smoking, good diet and being active and exercise on the regular basis.  He is looking forward to spring and summer because he does love kayaking and have no difficulty doing it.   Medication Adjustments/Labs and Tests Ordered: Current medicines are reviewed at length with the patient today.  Concerns regarding medicines are outlined above.  No orders of the defined types were placed in this encounter.  Medication changes: No orders of the defined  types were placed in this encounter.   Signed, 02-12-1993, MD, [facility] 10/15/2020 10:22 AM    [facility]

## 2021-03-04 IMAGING — RF LEFT ANKLE COMPLETE - 3+ VIEW
1 series · 4 of 4 positions shown · non-contrast
Comparison: 03/01/2019

CLINICAL DATA: Status post ORIF of left ankle fracture.

EXAM:
LEFT ANKLE COMPLETE - 3+ VIEW; DG C-ARM 61-120 MIN

[Series 1: run · 4 of 4 slices shown]
[im 1/4]
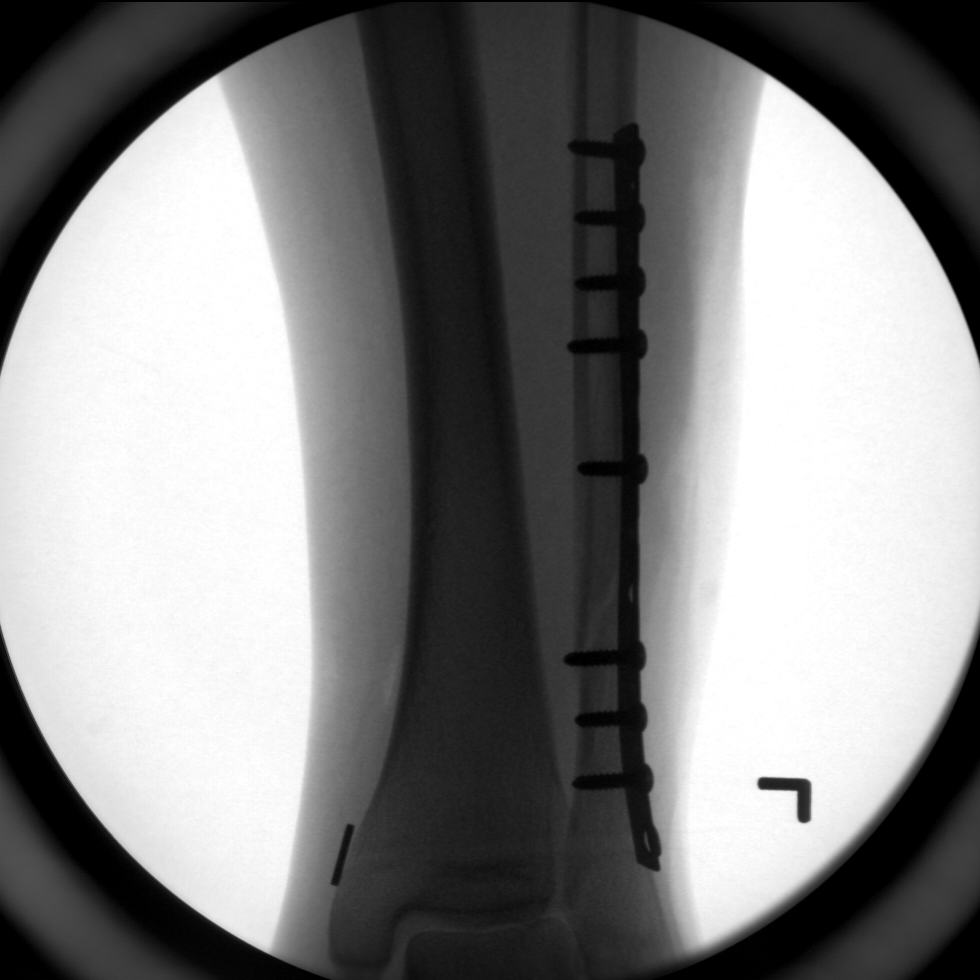
[im 2/4]
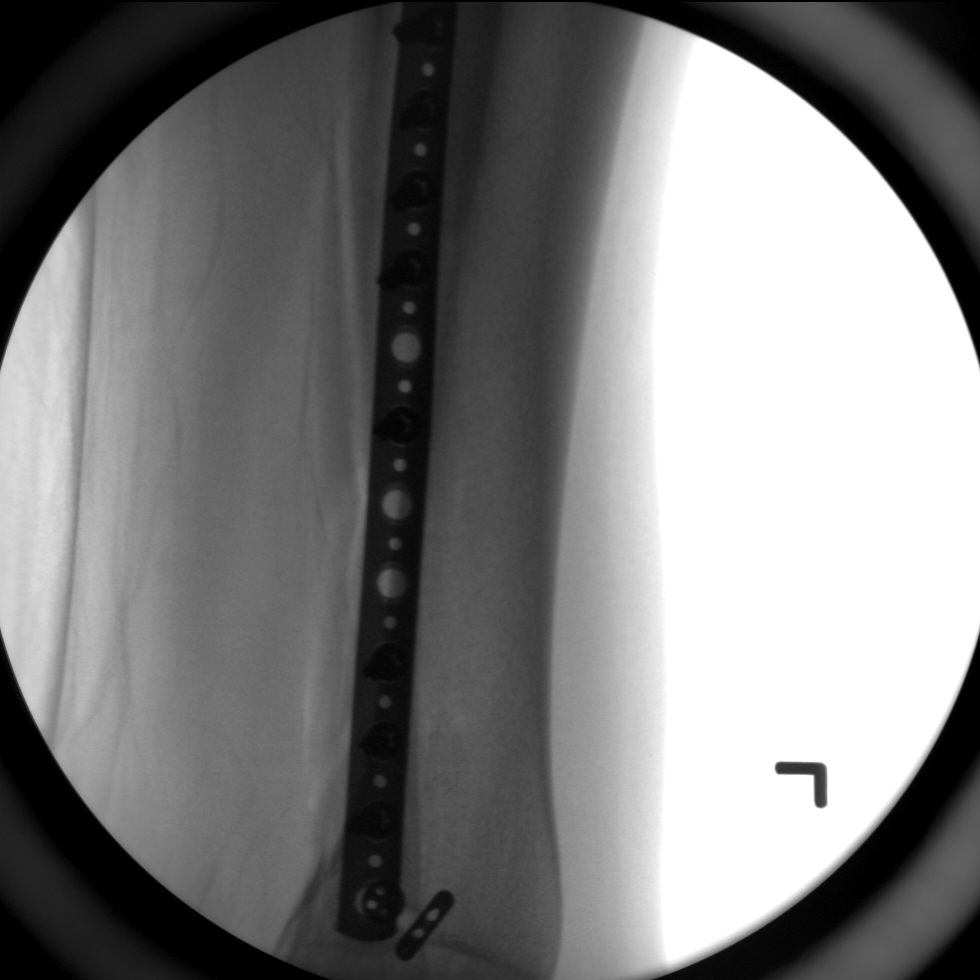
[im 3/4]
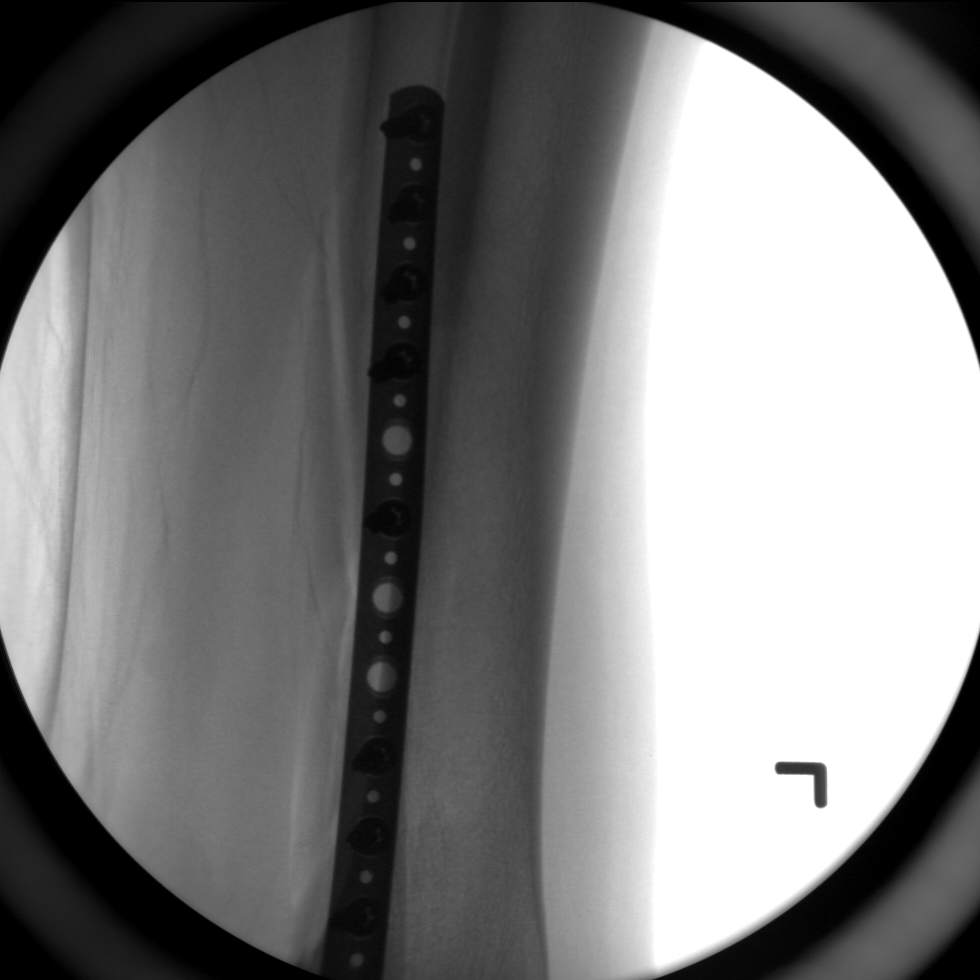
[im 4/4]
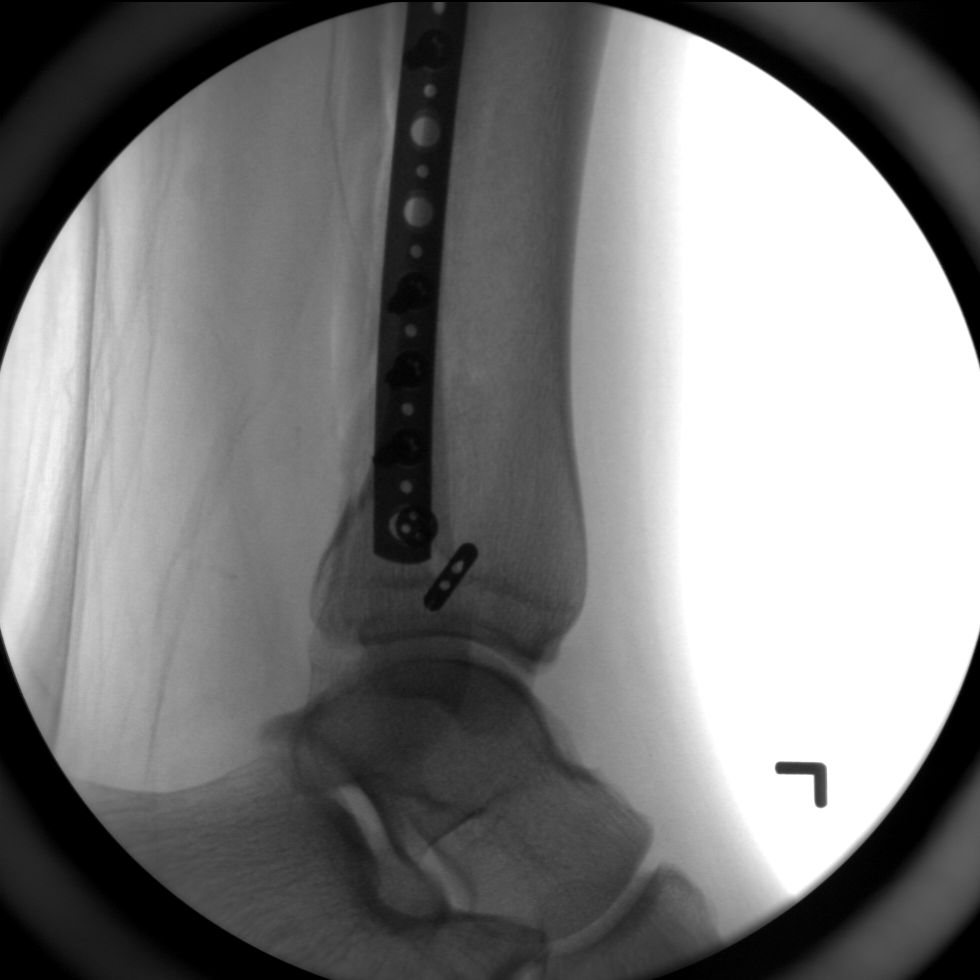

[4 of 4 positions shown; findings below may reference images not displayed]

FINDINGS: Interval screw and plate fixation of distal fibular fracture.
Hardware components and fracture fragments are in anatomic
alignment. No complications.
IMPRESSION: 1. Status post ORIF of left fibular fracture.

## 2024-03-27 ENCOUNTER — Other Ambulatory Visit (HOSPITAL_BASED_OUTPATIENT_CLINIC_OR_DEPARTMENT_OTHER): Payer: Self-pay | Admitting: Family Medicine

## 2024-03-27 DIAGNOSIS — H532 Diplopia: Secondary | ICD-10-CM

## 2024-03-27 DIAGNOSIS — R253 Fasciculation: Secondary | ICD-10-CM

## 2024-03-27 DIAGNOSIS — R519 Headache, unspecified: Secondary | ICD-10-CM

## 2024-04-09 ENCOUNTER — Ambulatory Visit (INDEPENDENT_AMBULATORY_CARE_PROVIDER_SITE_OTHER)
Admission: RE | Admit: 2024-04-09 | Discharge: 2024-04-09 | Disposition: A | Discharge status: Home / Self Care | Source: Ambulatory Visit | Attending: Family Medicine | Admitting: Family Medicine

## 2024-04-09 ENCOUNTER — Encounter (HOSPITAL_BASED_OUTPATIENT_CLINIC_OR_DEPARTMENT_OTHER): Payer: Self-pay | Admitting: Family Medicine

## 2024-04-09 DIAGNOSIS — R253 Fasciculation: Secondary | ICD-10-CM

## 2024-04-09 DIAGNOSIS — H532 Diplopia: Secondary | ICD-10-CM

## 2024-04-09 DIAGNOSIS — R519 Headache, unspecified: Secondary | ICD-10-CM

## 2024-04-09 MED ORDER — GADOBUTROL 1 MMOL/ML IV SOLN
9.1000 mL | Freq: Once | INTRAVENOUS | Status: AC | PRN
  Administered 2024-04-09: 9.1 mL via INTRAVENOUS
